# Patient Record
Sex: Female | Born: 1975 | Race: Black or African American | Hispanic: No | Smoking: Never smoker
Health system: Southern US, Community
[De-identification: ages and names within clinical notes are randomized; demographics above are authoritative.]

## PROBLEM LIST (undated history)

## (undated) DIAGNOSIS — R112 Nausea with vomiting, unspecified: Secondary | ICD-10-CM

## (undated) DIAGNOSIS — F32A Depression, unspecified: Secondary | ICD-10-CM

## (undated) DIAGNOSIS — IMO0002 Reserved for concepts with insufficient information to code with codable children: Secondary | ICD-10-CM

## (undated) DIAGNOSIS — B009 Herpesviral infection, unspecified: Secondary | ICD-10-CM

## (undated) DIAGNOSIS — Z8619 Personal history of other infectious and parasitic diseases: Secondary | ICD-10-CM

## (undated) DIAGNOSIS — F329 Major depressive disorder, single episode, unspecified: Secondary | ICD-10-CM

## (undated) DIAGNOSIS — F419 Anxiety disorder, unspecified: Secondary | ICD-10-CM

## (undated) DIAGNOSIS — M9420 Chondromalacia, unspecified site: Secondary | ICD-10-CM

## (undated) DIAGNOSIS — L729 Follicular cyst of the skin and subcutaneous tissue, unspecified: Secondary | ICD-10-CM

## (undated) DIAGNOSIS — G43909 Migraine, unspecified, not intractable, without status migrainosus: Secondary | ICD-10-CM

## (undated) DIAGNOSIS — Z9889 Other specified postprocedural states: Secondary | ICD-10-CM

## (undated) DIAGNOSIS — N3010 Interstitial cystitis (chronic) without hematuria: Secondary | ICD-10-CM

## (undated) DIAGNOSIS — E559 Vitamin D deficiency, unspecified: Secondary | ICD-10-CM

## (undated) DIAGNOSIS — M1990 Unspecified osteoarthritis, unspecified site: Secondary | ICD-10-CM

## (undated) DIAGNOSIS — G5600 Carpal tunnel syndrome, unspecified upper limb: Secondary | ICD-10-CM

## (undated) DIAGNOSIS — L732 Hidradenitis suppurativa: Secondary | ICD-10-CM

## (undated) HISTORY — PX: TUMOR REMOVAL: SHX12

## (undated) HISTORY — DX: Hidradenitis suppurativa: L73.2

## (undated) HISTORY — PX: CARPAL TUNNEL RELEASE: SHX101

## (undated) HISTORY — DX: Major depressive disorder, single episode, unspecified: F32.9

## (undated) HISTORY — PX: WISDOM TOOTH EXTRACTION: SHX21

## (undated) HISTORY — PX: BREAST CYST EXCISION: SHX579

## (undated) HISTORY — DX: Chondromalacia, unspecified site: M94.20

## (undated) HISTORY — DX: Herpesviral infection, unspecified: B00.9

## (undated) HISTORY — DX: Vitamin D deficiency, unspecified: E55.9

## (undated) HISTORY — DX: Unspecified osteoarthritis, unspecified site: M19.90

## (undated) HISTORY — DX: Personal history of other infectious and parasitic diseases: Z86.19

## (undated) HISTORY — DX: Reserved for concepts with insufficient information to code with codable children: IMO0002

## (undated) HISTORY — DX: Carpal tunnel syndrome, unspecified upper limb: G56.00

## (undated) HISTORY — DX: Depression, unspecified: F32.A

## (undated) HISTORY — PX: KNEE ARTHROSCOPY: SHX127

## (undated) HISTORY — DX: Follicular cyst of the skin and subcutaneous tissue, unspecified: L72.9

## (undated) HISTORY — DX: Anxiety disorder, unspecified: F41.9

## (undated) HISTORY — DX: Migraine, unspecified, not intractable, without status migrainosus: G43.909

## (undated) HISTORY — DX: Interstitial cystitis (chronic) without hematuria: N30.10

---

## 1998-02-15 ENCOUNTER — Emergency Department (HOSPITAL_COMMUNITY): Admission: EM | Admit: 1998-02-15 | Discharge: 1998-02-15 | Payer: Self-pay | Admitting: Emergency Medicine

## 1998-07-06 ENCOUNTER — Emergency Department (HOSPITAL_COMMUNITY): Admission: EM | Admit: 1998-07-06 | Discharge: 1998-07-06 | Payer: Self-pay | Admitting: Emergency Medicine

## 1998-07-06 ENCOUNTER — Encounter: Payer: Self-pay | Admitting: Emergency Medicine

## 1999-03-06 ENCOUNTER — Emergency Department (HOSPITAL_COMMUNITY): Admission: EM | Admit: 1999-03-06 | Discharge: 1999-03-06 | Payer: Self-pay | Admitting: Emergency Medicine

## 1999-05-22 ENCOUNTER — Inpatient Hospital Stay (HOSPITAL_COMMUNITY): Admission: AD | Admit: 1999-05-22 | Discharge: 1999-05-28 | Payer: Self-pay | Admitting: *Deleted

## 1999-08-09 ENCOUNTER — Other Ambulatory Visit: Admission: RE | Admit: 1999-08-09 | Discharge: 1999-08-22 | Payer: Self-pay | Admitting: Psychiatry

## 2000-06-30 ENCOUNTER — Encounter: Payer: Self-pay | Admitting: Gastroenterology

## 2000-06-30 ENCOUNTER — Ambulatory Visit (HOSPITAL_COMMUNITY): Admission: RE | Admit: 2000-06-30 | Discharge: 2000-06-30 | Payer: Self-pay | Admitting: Gastroenterology

## 2000-07-01 ENCOUNTER — Ambulatory Visit (HOSPITAL_COMMUNITY): Admission: RE | Admit: 2000-07-01 | Discharge: 2000-07-01 | Payer: Self-pay | Admitting: Gastroenterology

## 2000-07-16 ENCOUNTER — Other Ambulatory Visit: Admission: RE | Admit: 2000-07-16 | Discharge: 2000-07-16 | Payer: Self-pay | Admitting: Obstetrics and Gynecology

## 2000-07-16 ENCOUNTER — Encounter (INDEPENDENT_AMBULATORY_CARE_PROVIDER_SITE_OTHER): Payer: Self-pay

## 2000-08-26 DIAGNOSIS — IMO0002 Reserved for concepts with insufficient information to code with codable children: Secondary | ICD-10-CM

## 2000-08-26 DIAGNOSIS — R87619 Unspecified abnormal cytological findings in specimens from cervix uteri: Secondary | ICD-10-CM

## 2000-08-26 HISTORY — DX: Reserved for concepts with insufficient information to code with codable children: IMO0002

## 2000-08-26 HISTORY — DX: Unspecified abnormal cytological findings in specimens from cervix uteri: R87.619

## 2000-09-14 ENCOUNTER — Inpatient Hospital Stay (HOSPITAL_COMMUNITY): Admission: AD | Admit: 2000-09-14 | Discharge: 2000-09-14 | Payer: Self-pay | Admitting: Obstetrics and Gynecology

## 2000-11-04 ENCOUNTER — Emergency Department (HOSPITAL_COMMUNITY): Admission: EM | Admit: 2000-11-04 | Discharge: 2000-11-04 | Payer: Self-pay | Admitting: Emergency Medicine

## 2001-02-04 ENCOUNTER — Emergency Department (HOSPITAL_COMMUNITY): Admission: EM | Admit: 2001-02-04 | Discharge: 2001-02-05 | Payer: Self-pay | Admitting: Emergency Medicine

## 2001-02-05 ENCOUNTER — Encounter: Payer: Self-pay | Admitting: Emergency Medicine

## 2001-04-25 ENCOUNTER — Emergency Department (HOSPITAL_COMMUNITY): Admission: EM | Admit: 2001-04-25 | Discharge: 2001-04-26 | Payer: Self-pay | Admitting: Emergency Medicine

## 2001-04-26 ENCOUNTER — Encounter: Payer: Self-pay | Admitting: Emergency Medicine

## 2001-08-19 DIAGNOSIS — IMO0002 Reserved for concepts with insufficient information to code with codable children: Secondary | ICD-10-CM

## 2001-08-19 HISTORY — DX: Reserved for concepts with insufficient information to code with codable children: IMO0002

## 2001-10-28 ENCOUNTER — Other Ambulatory Visit: Admission: RE | Admit: 2001-10-28 | Discharge: 2001-10-28 | Payer: Self-pay | Admitting: Obstetrics and Gynecology

## 2002-02-12 ENCOUNTER — Other Ambulatory Visit: Admission: RE | Admit: 2002-02-12 | Discharge: 2002-02-12 | Payer: Self-pay | Admitting: Obstetrics and Gynecology

## 2002-03-04 ENCOUNTER — Encounter (INDEPENDENT_AMBULATORY_CARE_PROVIDER_SITE_OTHER): Payer: Self-pay | Admitting: *Deleted

## 2002-03-04 ENCOUNTER — Ambulatory Visit (HOSPITAL_COMMUNITY): Admission: RE | Admit: 2002-03-04 | Discharge: 2002-03-04 | Payer: Self-pay | Admitting: Obstetrics and Gynecology

## 2002-03-12 ENCOUNTER — Ambulatory Visit (HOSPITAL_COMMUNITY): Admission: RE | Admit: 2002-03-12 | Discharge: 2002-03-12 | Payer: Self-pay | Admitting: *Deleted

## 2002-03-12 ENCOUNTER — Encounter (INDEPENDENT_AMBULATORY_CARE_PROVIDER_SITE_OTHER): Payer: Self-pay

## 2002-06-15 ENCOUNTER — Encounter: Admission: RE | Admit: 2002-06-15 | Discharge: 2002-07-13 | Payer: Self-pay | Admitting: Family Medicine

## 2002-07-26 ENCOUNTER — Ambulatory Visit (HOSPITAL_COMMUNITY): Admission: RE | Admit: 2002-07-26 | Discharge: 2002-07-26 | Payer: Self-pay | Admitting: Obstetrics and Gynecology

## 2002-07-26 ENCOUNTER — Encounter: Payer: Self-pay | Admitting: Obstetrics and Gynecology

## 2002-12-22 ENCOUNTER — Encounter: Admission: RE | Admit: 2002-12-22 | Discharge: 2002-12-22 | Payer: Self-pay | Admitting: Chiropractic Medicine

## 2002-12-22 ENCOUNTER — Encounter: Payer: Self-pay | Admitting: Chiropractic Medicine

## 2003-01-05 ENCOUNTER — Other Ambulatory Visit: Admission: RE | Admit: 2003-01-05 | Discharge: 2003-01-05 | Payer: Self-pay | Admitting: *Deleted

## 2003-01-12 ENCOUNTER — Emergency Department (HOSPITAL_COMMUNITY): Admission: EM | Admit: 2003-01-12 | Discharge: 2003-01-12 | Payer: Self-pay | Admitting: Emergency Medicine

## 2003-05-26 ENCOUNTER — Ambulatory Visit (HOSPITAL_COMMUNITY): Admission: RE | Admit: 2003-05-26 | Discharge: 2003-05-26 | Payer: Self-pay | Admitting: Surgery

## 2003-05-26 ENCOUNTER — Ambulatory Visit (HOSPITAL_BASED_OUTPATIENT_CLINIC_OR_DEPARTMENT_OTHER): Admission: RE | Admit: 2003-05-26 | Discharge: 2003-05-26 | Payer: Self-pay | Admitting: Surgery

## 2003-05-26 ENCOUNTER — Encounter (INDEPENDENT_AMBULATORY_CARE_PROVIDER_SITE_OTHER): Payer: Self-pay | Admitting: Specialist

## 2003-10-16 ENCOUNTER — Emergency Department (HOSPITAL_COMMUNITY): Admission: AD | Admit: 2003-10-16 | Discharge: 2003-10-16 | Payer: Self-pay | Admitting: Family Medicine

## 2003-12-14 ENCOUNTER — Emergency Department (HOSPITAL_COMMUNITY): Admission: EM | Admit: 2003-12-14 | Discharge: 2003-12-14 | Payer: Self-pay | Admitting: Family Medicine

## 2003-12-20 ENCOUNTER — Encounter: Admission: RE | Admit: 2003-12-20 | Discharge: 2004-01-19 | Payer: Self-pay | Admitting: Family Medicine

## 2004-01-15 ENCOUNTER — Encounter: Admission: RE | Admit: 2004-01-15 | Discharge: 2004-01-15 | Payer: Self-pay | Admitting: Family Medicine

## 2004-06-21 ENCOUNTER — Other Ambulatory Visit: Admission: RE | Admit: 2004-06-21 | Discharge: 2004-06-21 | Payer: Self-pay | Admitting: Obstetrics and Gynecology

## 2004-07-10 ENCOUNTER — Ambulatory Visit: Payer: Self-pay | Admitting: Family Medicine

## 2004-07-31 ENCOUNTER — Encounter: Admission: RE | Admit: 2004-07-31 | Discharge: 2004-07-31 | Payer: Self-pay | Admitting: Family Medicine

## 2004-07-31 ENCOUNTER — Ambulatory Visit: Payer: Self-pay | Admitting: Family Medicine

## 2004-08-08 ENCOUNTER — Emergency Department (HOSPITAL_COMMUNITY): Admission: EM | Admit: 2004-08-08 | Discharge: 2004-08-08 | Payer: Self-pay | Admitting: Family Medicine

## 2004-11-27 ENCOUNTER — Ambulatory Visit: Payer: Self-pay | Admitting: Family Medicine

## 2004-12-14 ENCOUNTER — Ambulatory Visit: Payer: Self-pay | Admitting: Family Medicine

## 2005-02-13 ENCOUNTER — Ambulatory Visit: Payer: Self-pay | Admitting: Family Medicine

## 2005-06-19 ENCOUNTER — Ambulatory Visit: Payer: Self-pay | Admitting: Family Medicine

## 2005-07-05 ENCOUNTER — Other Ambulatory Visit: Admission: RE | Admit: 2005-07-05 | Discharge: 2005-07-05 | Payer: Self-pay | Admitting: Obstetrics and Gynecology

## 2005-08-19 DIAGNOSIS — B009 Herpesviral infection, unspecified: Secondary | ICD-10-CM

## 2005-08-19 DIAGNOSIS — N3010 Interstitial cystitis (chronic) without hematuria: Secondary | ICD-10-CM

## 2005-08-19 HISTORY — DX: Interstitial cystitis (chronic) without hematuria: N30.10

## 2005-08-19 HISTORY — DX: Herpesviral infection, unspecified: B00.9

## 2005-09-17 ENCOUNTER — Ambulatory Visit: Payer: Self-pay | Admitting: Family Medicine

## 2006-01-03 ENCOUNTER — Ambulatory Visit: Payer: Self-pay | Admitting: Family Medicine

## 2006-05-07 ENCOUNTER — Ambulatory Visit: Payer: Self-pay | Admitting: Family Medicine

## 2006-05-14 ENCOUNTER — Ambulatory Visit: Payer: Self-pay | Admitting: Family Medicine

## 2006-06-13 ENCOUNTER — Ambulatory Visit: Payer: Self-pay | Admitting: Family Medicine

## 2006-07-08 ENCOUNTER — Other Ambulatory Visit: Admission: RE | Admit: 2006-07-08 | Discharge: 2006-07-08 | Payer: Self-pay | Admitting: Obstetrics and Gynecology

## 2006-09-07 ENCOUNTER — Emergency Department (HOSPITAL_COMMUNITY): Admission: EM | Admit: 2006-09-07 | Discharge: 2006-09-07 | Payer: Self-pay | Admitting: Family Medicine

## 2006-09-12 ENCOUNTER — Ambulatory Visit (HOSPITAL_COMMUNITY): Admission: RE | Admit: 2006-09-12 | Discharge: 2006-09-12 | Payer: Self-pay | Admitting: Urology

## 2006-09-28 ENCOUNTER — Emergency Department (HOSPITAL_COMMUNITY): Admission: EM | Admit: 2006-09-28 | Discharge: 2006-09-28 | Payer: Self-pay | Admitting: Family Medicine

## 2006-10-07 ENCOUNTER — Ambulatory Visit: Payer: Self-pay | Admitting: Family Medicine

## 2007-03-21 ENCOUNTER — Ambulatory Visit: Payer: Self-pay | Admitting: Family Medicine

## 2007-04-05 ENCOUNTER — Emergency Department (HOSPITAL_COMMUNITY): Admission: EM | Admit: 2007-04-05 | Discharge: 2007-04-05 | Payer: Self-pay | Admitting: Emergency Medicine

## 2007-05-26 DIAGNOSIS — J45909 Unspecified asthma, uncomplicated: Secondary | ICD-10-CM | POA: Insufficient documentation

## 2007-05-26 DIAGNOSIS — I10 Essential (primary) hypertension: Secondary | ICD-10-CM

## 2007-06-02 ENCOUNTER — Ambulatory Visit: Payer: Self-pay | Admitting: Family Medicine

## 2007-06-19 ENCOUNTER — Ambulatory Visit: Payer: Self-pay | Admitting: Family Medicine

## 2007-06-19 DIAGNOSIS — J018 Other acute sinusitis: Secondary | ICD-10-CM

## 2007-06-19 DIAGNOSIS — S335XXA Sprain of ligaments of lumbar spine, initial encounter: Secondary | ICD-10-CM

## 2007-08-02 ENCOUNTER — Emergency Department (HOSPITAL_COMMUNITY): Admission: EM | Admit: 2007-08-02 | Discharge: 2007-08-02 | Payer: Self-pay | Admitting: Emergency Medicine

## 2007-08-18 ENCOUNTER — Ambulatory Visit: Payer: Self-pay | Admitting: Family Medicine

## 2007-08-18 DIAGNOSIS — G5600 Carpal tunnel syndrome, unspecified upper limb: Secondary | ICD-10-CM

## 2007-08-18 DIAGNOSIS — M6740 Ganglion, unspecified site: Secondary | ICD-10-CM | POA: Insufficient documentation

## 2007-08-21 ENCOUNTER — Encounter: Payer: Self-pay | Admitting: Family Medicine

## 2007-09-18 ENCOUNTER — Ambulatory Visit: Payer: Self-pay | Admitting: Family Medicine

## 2007-09-18 DIAGNOSIS — J019 Acute sinusitis, unspecified: Secondary | ICD-10-CM

## 2007-10-20 ENCOUNTER — Emergency Department (HOSPITAL_COMMUNITY): Admission: EM | Admit: 2007-10-20 | Discharge: 2007-10-20 | Payer: Self-pay | Admitting: Family Medicine

## 2007-12-29 ENCOUNTER — Emergency Department (HOSPITAL_COMMUNITY): Admission: EM | Admit: 2007-12-29 | Discharge: 2007-12-29 | Payer: Self-pay | Admitting: Family Medicine

## 2007-12-30 ENCOUNTER — Ambulatory Visit: Payer: Self-pay | Admitting: Family Medicine

## 2007-12-30 DIAGNOSIS — R519 Headache, unspecified: Secondary | ICD-10-CM | POA: Insufficient documentation

## 2007-12-30 DIAGNOSIS — R51 Headache: Secondary | ICD-10-CM

## 2007-12-31 LAB — CONVERTED CEMR LAB
ALT: 12 units/L (ref 0–35)
AST: 15 units/L (ref 0–37)
Basophils Relative: 0.2 % (ref 0.0–1.0)
CO2: 27 meq/L (ref 19–32)
GFR calc Af Amer: 126 mL/min
Glucose, Bld: 94 mg/dL (ref 70–99)
HCT: 37.9 % (ref 36.0–46.0)
HDL: 44.8 mg/dL (ref >39.0)
Hemoglobin: 12.6 g/dL (ref 12.0–15.0)
Lymphocytes Relative: 38.8 % (ref 12.0–46.0)
MCHC: 33.3 g/dL (ref 30.0–36.0)
Monocytes Absolute: 0.3 10*3/uL (ref 0.1–1.0)
Monocytes Relative: 8.1 % (ref 3.0–12.0)
Neutro Abs: 2.1 10*3/uL (ref 1.4–7.7)
Neutrophils Relative %: 52.1 % (ref 43.0–77.0)
Platelets: 208 10*3/uL (ref 150–400)
Total Bilirubin: 0.7 mg/dL (ref 0.3–1.2)
Triglycerides: 51 mg/dL (ref 0–149)

## 2008-01-01 ENCOUNTER — Emergency Department (HOSPITAL_COMMUNITY): Admission: EM | Admit: 2008-01-01 | Discharge: 2008-01-01 | Payer: Self-pay | Admitting: Emergency Medicine

## 2008-01-01 ENCOUNTER — Telehealth: Payer: Self-pay | Admitting: Family Medicine

## 2008-03-25 ENCOUNTER — Ambulatory Visit: Payer: Self-pay | Admitting: Family Medicine

## 2008-03-25 DIAGNOSIS — E663 Overweight: Secondary | ICD-10-CM | POA: Insufficient documentation

## 2008-03-25 DIAGNOSIS — K219 Gastro-esophageal reflux disease without esophagitis: Secondary | ICD-10-CM

## 2008-05-13 ENCOUNTER — Emergency Department (HOSPITAL_COMMUNITY): Admission: EM | Admit: 2008-05-13 | Discharge: 2008-05-13 | Payer: Self-pay | Admitting: Family Medicine

## 2008-05-31 ENCOUNTER — Ambulatory Visit: Payer: Self-pay | Admitting: Family Medicine

## 2008-08-05 ENCOUNTER — Encounter: Payer: Self-pay | Admitting: Family Medicine

## 2008-11-17 ENCOUNTER — Ambulatory Visit: Payer: Self-pay | Admitting: Family Medicine

## 2008-11-17 LAB — CONVERTED CEMR LAB
Glucose, Urine, Semiquant: NEGATIVE
Ketones, urine, test strip: NEGATIVE
Nitrite: NEGATIVE
Urobilinogen, UA: 0.2
pH: 7

## 2008-11-23 ENCOUNTER — Encounter: Payer: Self-pay | Admitting: Family Medicine

## 2008-11-23 LAB — CONVERTED CEMR LAB
Albumin: 4 g/dL (ref 3.5–5.2)
BUN: 11 mg/dL (ref 6–23)
Basophils Relative: 0.2 % (ref 0.0–3.0)
CO2: 28 meq/L (ref 19–32)
Calcium: 9.2 mg/dL (ref 8.4–10.5)
Cholesterol: 160 mg/dL (ref 0–200)
Creatinine, Ser: 0.9 mg/dL (ref 0.4–1.2)
Eosinophils Absolute: 0 10*3/uL (ref 0.0–0.7)
HCT: 36.2 % (ref 36.0–46.0)
Hemoglobin: 12.5 g/dL (ref 12.0–15.0)
Lymphs Abs: 1.7 10*3/uL (ref 0.7–4.0)
MCHC: 34.6 g/dL (ref 30.0–36.0)
MCV: 86.2 fL (ref 78.0–100.0)
Monocytes Absolute: 0.3 10*3/uL (ref 0.1–1.0)
Monocytes Relative: 7.7 % (ref 3.0–12.0)
Neutro Abs: 1.9 10*3/uL (ref 1.4–7.7)
Potassium: 4.2 meq/L (ref 3.5–5.1)
RBC: 4.2 M/uL (ref 3.87–5.11)
TSH: 0.72 microintl units/mL (ref 0.35–5.50)
Total Protein: 7.1 g/dL (ref 6.0–8.3)

## 2008-11-25 ENCOUNTER — Ambulatory Visit: Payer: Self-pay | Admitting: Family Medicine

## 2008-11-28 ENCOUNTER — Telehealth: Payer: Self-pay | Admitting: Family Medicine

## 2008-12-02 ENCOUNTER — Ambulatory Visit: Payer: Self-pay | Admitting: Family Medicine

## 2008-12-02 DIAGNOSIS — R5383 Other fatigue: Secondary | ICD-10-CM

## 2008-12-02 DIAGNOSIS — R5381 Other malaise: Secondary | ICD-10-CM

## 2008-12-05 ENCOUNTER — Encounter: Payer: Self-pay | Admitting: Family Medicine

## 2008-12-05 LAB — CONVERTED CEMR LAB
Folate: 9.8 ng/mL
Free T4: 0.8 ng/dL (ref 0.6–1.6)
T3, Free: 2.8 pg/mL (ref 2.3–4.2)
Vitamin B-12: 549 pg/mL (ref 211–911)

## 2008-12-06 LAB — CONVERTED CEMR LAB: Vit D, 25-Hydroxy: 48 ng/mL (ref 30–89)

## 2008-12-14 ENCOUNTER — Encounter: Payer: Self-pay | Admitting: Family Medicine

## 2008-12-28 ENCOUNTER — Encounter: Payer: Self-pay | Admitting: Family Medicine

## 2009-03-09 ENCOUNTER — Ambulatory Visit (HOSPITAL_BASED_OUTPATIENT_CLINIC_OR_DEPARTMENT_OTHER): Admission: RE | Admit: 2009-03-09 | Discharge: 2009-03-09 | Payer: Self-pay | Admitting: Orthopedic Surgery

## 2009-04-13 ENCOUNTER — Ambulatory Visit (HOSPITAL_BASED_OUTPATIENT_CLINIC_OR_DEPARTMENT_OTHER): Admission: RE | Admit: 2009-04-13 | Discharge: 2009-04-13 | Payer: Self-pay | Admitting: Orthopedic Surgery

## 2009-05-12 ENCOUNTER — Ambulatory Visit: Payer: Self-pay | Admitting: Family Medicine

## 2009-05-17 ENCOUNTER — Ambulatory Visit: Payer: Self-pay | Admitting: Family Medicine

## 2009-12-16 ENCOUNTER — Emergency Department (HOSPITAL_COMMUNITY): Admission: EM | Admit: 2009-12-16 | Discharge: 2009-12-16 | Payer: Self-pay | Admitting: Family Medicine

## 2009-12-20 ENCOUNTER — Ambulatory Visit: Payer: Self-pay | Admitting: Family Medicine

## 2009-12-20 LAB — CONVERTED CEMR LAB
Glucose, Urine, Semiquant: NEGATIVE
Urobilinogen, UA: 0.2
pH: 8

## 2009-12-22 LAB — CONVERTED CEMR LAB
ALT: 10 units/L (ref 0–35)
AST: 16 units/L (ref 0–37)
Albumin: 3.5 g/dL (ref 3.5–5.2)
CO2: 27 meq/L (ref 19–32)
Calcium: 9 mg/dL (ref 8.4–10.5)
Eosinophils Absolute: 0 10*3/uL (ref 0.0–0.7)
Glucose, Bld: 84 mg/dL (ref 70–99)
HCT: 37.2 % (ref 36.0–46.0)
HDL: 55.1 mg/dL (ref >39.00)
Lymphocytes Relative: 36.9 % (ref 12.0–46.0)
Lymphs Abs: 2 10*3/uL (ref 0.7–4.0)
MCV: 86.4 fL (ref 78.0–100.0)
Monocytes Absolute: 0.3 10*3/uL (ref 0.1–1.0)
Monocytes Relative: 6 % (ref 3.0–12.0)
Neutrophils Relative %: 56.3 % (ref 43.0–77.0)
RDW: 13.5 % (ref 11.5–14.6)
Sodium: 141 meq/L (ref 135–145)
TSH: 1.49 microintl units/mL (ref 0.35–5.50)
Triglycerides: 142 mg/dL (ref 0.0–149.0)

## 2009-12-27 ENCOUNTER — Ambulatory Visit: Payer: Self-pay | Admitting: Family Medicine

## 2010-05-19 ENCOUNTER — Emergency Department (HOSPITAL_COMMUNITY): Admission: EM | Admit: 2010-05-19 | Discharge: 2010-05-19 | Payer: Self-pay | Admitting: Family Medicine

## 2010-05-30 ENCOUNTER — Ambulatory Visit: Payer: Self-pay | Admitting: Family Medicine

## 2010-05-30 DIAGNOSIS — S46819A Strain of other muscles, fascia and tendons at shoulder and upper arm level, unspecified arm, initial encounter: Secondary | ICD-10-CM

## 2010-05-30 DIAGNOSIS — S43499A Other sprain of unspecified shoulder joint, initial encounter: Secondary | ICD-10-CM

## 2010-06-04 ENCOUNTER — Encounter: Payer: Self-pay | Admitting: Family Medicine

## 2010-06-05 ENCOUNTER — Telehealth: Payer: Self-pay | Admitting: Family Medicine

## 2010-06-07 ENCOUNTER — Telehealth: Payer: Self-pay | Admitting: Family Medicine

## 2010-06-08 ENCOUNTER — Encounter: Payer: Self-pay | Admitting: Family Medicine

## 2010-08-23 ENCOUNTER — Emergency Department (HOSPITAL_COMMUNITY)
Admission: EM | Admit: 2010-08-23 | Discharge: 2010-08-23 | Payer: Self-pay | Source: Home / Self Care | Admitting: Family Medicine

## 2010-09-20 NOTE — Letter (Signed)
Summary: Attending Physician's Statement for Short Term Disability   Attending Physician's Statement for Short Term Disability   Imported By: Maryln Gottron 06/05/2010 13:56:49  _____________________________________________________________________  External Attachment:    Type:   Image     Comment:   External Document

## 2010-09-20 NOTE — Letter (Signed)
Summary: Generic Letter  [place] [facility]  [address] [place], [place] [address]   Phone: [phone]  Fax: [phone]    06/08/2010       To Whom It May Concern:  Pamela Lopez , DOB 27-Oct-1975  may return to work on Jun 10, 2010 full duty with no restrictions.  If any questions or concerns please feel free to contact me.            Sincerely,       Dr Kathie Rhodes. Fry,MD

## 2010-09-20 NOTE — Letter (Signed)
Summary: Return to Work   Return to Work   Imported By: Maryln Gottron 06/01/2010 15:24:01  _____________________________________________________________________  External Attachment:    Type:   Image     Comment:   External Document

## 2010-09-20 NOTE — Progress Notes (Signed)
Summary: another work note with no restrictions  Phone Note Call from Patient Call back at [phone]   Caller: Patient Call For: Nelwyn Salisbury MD Summary of Call: Pt has work note to return to work on 10/23-2011 she needs another note to specify full duty no restrictions. pt will pick up note Initial call taken by: Heron Sabins,  June 07, 2010 12:26 PM  Follow-up for Phone Call        please take care of this  Follow-up by: Nelwyn Salisbury MD,  June 08, 2010 9:32 AM  Additional Follow-up for Phone Call Additional follow up Details #1::        pt notified and will pick up note today  Additional Follow-up by: Pura Spice, RN,  June 08, 2010 9:37 AM

## 2010-09-20 NOTE — Assessment & Plan Note (Signed)
Summary: Pt injured lft arm/can not extend/cjr   Vital Signs:  Patient profile:   35 year old female Menstrual status:  regular O2 Sat:      98 % Temp:     98.7 degrees F Pulse rate:   97 / minute BP sitting:   130 / 80  (left arm) Cuff size:   large  Vitals Entered By: Pura Spice, RN (May 30, 2010 2:16 PM) CC: pain in left arm since monday nite moving TV. Ck abscess area on chest area.    History of Present Illness: Here to follow up on an injury to her left arm which occurred at home on 05-28-10. While lifting a heavy TV set she felt a pulling sensation in the left upper arm. Later that day the pain became worse and the upper arm swelled. Then she noticed some tingling in the fingers of the left hand. She went to Urgent Care that night and was given some Diclofenac. This has not helped, and the arm still hurts a lot.   Allergies: 1)  ! Codeine 2)  ! * Monistat 3)  ! * Latex  Past History:  Past Medical History: Reviewed history from 12/27/2009 and no changes required. Migraines, sees Dr. Catalina Lunger at the [facility] Asthma Hypertension sees Dr. Josph Macho for GYN exams knee pains, has DJD and chondromalacia, sees Dr. Eulah Pont recurrent skin cysts, sees Joneen Caraway at [facility] Dermatology bilateral carpal tunnel syndrome, sees the [facility]  Past Surgical History: Reviewed history from 12/27/2009 and no changes required. R Breast Cyst removal left knee arthroscopy per Dr. Eulah Pont Carpal tunnel release, right 03-09-09 per Dr. Eulah Pont Carpal tunnel release, left 04-13-09 per Dr. Eulah Pont  Review of Systems  The patient denies anorexia, fever, weight loss, weight gain, vision loss, decreased hearing, hoarseness, chest pain, syncope, dyspnea on exertion, peripheral edema, prolonged cough, headaches, hemoptysis, abdominal pain, melena, hematochezia, severe indigestion/heartburn, hematuria, incontinence, genital sores, muscle weakness, suspicious skin  lesions, transient blindness, difficulty walking, depression, unusual weight change, abnormal bleeding, enlarged lymph nodes, angioedema, breast masses, and testicular masses.    Physical Exam  General:  Well-developed,well-nourished,in no acute distress; alert,appropriate and cooperative throughout examination Msk:  the left upper arm is mildly swollen and quite tender in the belly of the biceps muscle. She has full flexion of the elbow but extension is limited by pain. The tendon does not appear to be torn. Neurovascular in the left hand is intact   Impression & Recommendations:  Problem # 1:  SPRAIN&STRAIN OTH SPEC SITES SHOULDER&UPPER ARM (ICD-840.8)  Orders: Slings- All Types (E4540)  Complete Medication List: 1)  Promethazine Hcl 25 Mg Tabs (Promethazine hcl) .Marland Kitchen.. 1 every 4 hours as needed nausea 2)  Proair Hfa 108 (90 Base) Mcg/act Aers (Albuterol sulfate) .... 2 puffs q 4 hours as needed 3)  Junel Fe 1/20 1-20 Mg-mcg Tabs (Norethin ace-eth estrad-fe) .Marland Kitchen.. 1 once daily 4)  Naratriptan Hcl 2.5 Mg Tabs (Naratriptan hcl) .... As needed headache 5)  Doxycycline Hyclate 100 Mg Caps (Doxycycline hyclate) .... Urgent care x 10 days 6)  Diclofenac Sodium 75 Mg Tbec (Diclofenac sodium) 7)  Vicodin 5-500 Mg Tabs (Hydrocodone-acetaminophen) .Marland Kitchen.. 1 q 6 hours as needed pain  Patient Instructions: 1)  She appears to have a tear in the biceps muscle. The tendons seem to be intact. We will immobilize the arm with a sling for at least one week. Use ice packs. Use Vicodin for pain. Written out of work from  today until 06-10-10.  2)  Please schedule a follow-up appointment as needed .  Prescriptions: VICODIN 5-500 MG TABS (HYDROCODONE-ACETAMINOPHEN) 1 q 6 hours as needed pain  #60 x 0   Entered and Authorized by:   Nelwyn Salisbury MD   Signed by:   Nelwyn Salisbury MD on 05/30/2010   Method used:   Print then Give to Patient   RxID:   [id number]   Appended Document: Pt injured lft arm/can not  extend/cjr    Clinical Lists Changes  Orders: Added new Service order of Admin 1st Vaccine (95621) - Signed Added new Service order of Flu Vaccine 30yrs + [phone]) - Signed Observations: Added new observation of ROS: Flu Vaccine Consent Questions     Do you have a history of severe allergic reactions to this vaccine? no    Any prior history of allergic reactions to egg and/or gelatin? no    Do you have a sensitivity to the preservative Thimersol? no    Do you have a past history of Guillan-Barre Syndrome? no    Do you currently have an acute febrile illness? no    Have you ever had a severe reaction to latex? no    Vaccine information given and explained to patient? yes    Are you currently pregnant? no    Lot Number:[id number]   Exp Date:02/16/2011   Site Given  Left Deltoid IM Pura Spice, RN  May 30, 2010 3:05 PM  (05/30/2010 15:03) Added new observation of FLU VAX VIS: 03/13/10 version (05/30/2010 15:03) Added new observation of FLU VAXLOT: [id number] (05/30/2010 15:03) Added new observation of FLU VAXMFR: [employer] (05/30/2010 15:03) Added new observation of FLU VAX EXP: 02/16/2011 (05/30/2010 15:03) Added new observation of FLU VAX DSE: 0.78ml (05/30/2010 15:03) Added new observation of FLU VAX: Fluvax 3+ (05/30/2010 15:03)          Review of Systems       Flu Vaccine Consent Questions     Do you have a history of severe allergic reactions to this vaccine? no    Any prior history of allergic reactions to egg and/or gelatin? no    Do you have a sensitivity to the preservative Thimersol? no    Do you have a past history of Guillan-Barre Syndrome? no    Do you currently have an acute febrile illness? no    Have you ever had a severe reaction to latex? no    Vaccine information given and explained to patient? yes    Are you currently pregnant? no    Lot Number:[id number]   Exp Date:02/16/2011   Site Given  Left Deltoid IM Pura Spice, RN  May 30, 2010  3:05 PM

## 2010-09-20 NOTE — Assessment & Plan Note (Signed)
Summary: cpx---no pap//ccm   Vital Signs:  Patient profile:   35 year old female Menstrual status:  regular Weight:      216 pounds BMI:     34.47 BP sitting:   120 / 86  (left arm) Cuff size:   regular  Vitals Entered By: Raechel Ache, RN (Dec 27, 2009 10:02 AM) CC: CPX, labs done. Sees gyn. C/o headaches and went to ER about 2 weeks ago; was diagnosed with vertigo.   History of Present Illness: 35 yr old female for a cpx. She is doing well in general, although she has been dealing with some intermittent vertigo for the past 2 weeks. She was seen in the ER last week and had a normal exam. She was given some Meclizine to use, and this has been helpful. She has had some sinus stuffines and some sneezing for the past month, and it is evident that she has been dealing with pollen allergies.   Allergies: 1)  ! Codeine 2)  ! * Monistat 3)  ! * Latex  Past History:  Past Medical History: Migraines, sees Dr. Catalina Lunger at the [facility] Asthma Hypertension sees Dr. Josph Macho for GYN exams knee pains, has DJD and chondromalacia, sees Dr. Eulah Pont recurrent skin cysts, sees Joneen Caraway at [facility] Dermatology bilateral carpal tunnel syndrome, sees the [facility]  Past Surgical History: R Breast Cyst removal left knee arthroscopy per Dr. Eulah Pont Carpal tunnel release, right 03-09-09 per Dr. Eulah Pont Carpal tunnel release, left 04-13-09 per Dr. Eulah Pont  Family History: Reviewed history from 05/26/2007 and no changes required. Family History of Arthritis Family History Breast cancer 1st degree relative <50 Family History Diabetes 1st degree relative Family History High cholesterol Family History Hypertension Family History Kidney disease Family History of Prostate CA 1st degree relative <50 Family History of Stroke M 1st degree relative <50 Family History of Cardiovascular disorder  Social History: Reviewed history from 05/26/2007 and no changes  required. Occupation: Single Never Smoked Alcohol use-no Drug use-no Regular exercise-yes  Review of Systems  The patient denies anorexia, fever, weight loss, weight gain, vision loss, decreased hearing, hoarseness, chest pain, syncope, dyspnea on exertion, peripheral edema, prolonged cough, headaches, hemoptysis, abdominal pain, melena, hematochezia, severe indigestion/heartburn, hematuria, incontinence, genital sores, muscle weakness, suspicious skin lesions, transient blindness, difficulty walking, depression, unusual weight change, abnormal bleeding, enlarged lymph nodes, angioedema, breast masses, and testicular masses.    Physical Exam  General:  overweight-appearing.   Head:  Normocephalic and atraumatic without obvious abnormalities. No apparent alopecia or balding. Eyes:  No corneal or conjunctival inflammation noted. EOMI. Perrla. Funduscopic exam benign, without hemorrhages, exudates or papilledema. Vision grossly normal. Ears:  External ear exam shows no significant lesions or deformities.  Otoscopic examination reveals clear canals, tympanic membranes are intact bilaterally without bulging, retraction, inflammation or discharge. Hearing is grossly normal bilaterally. Nose:  External nasal examination shows no deformity or inflammation. Nasal mucosa are pink and moist without lesions or exudates. Mouth:  Oral mucosa and oropharynx without lesions or exudates.  Teeth in good repair. Neck:  No deformities, masses, or tenderness noted. Chest Wall:  No deformities, masses, or tenderness noted. Lungs:  Normal respiratory effort, chest expands symmetrically. Lungs are clear to auscultation, no crackles or wheezes. Heart:  Normal rate and regular rhythm. S1 and S2 normal without gallop, murmur, click, rub or other extra sounds. Abdomen:  Bowel sounds positive,abdomen soft and non-tender without masses, organomegaly or hernias noted. Msk:  No deformity or  scoliosis noted of thoracic or  lumbar spine.   Pulses:  R and L carotid,radial,femoral,dorsalis pedis and posterior tibial pulses are full and equal bilaterally Extremities:  No clubbing, cyanosis, edema, or deformity noted with normal full range of motion of all joints.   Neurologic:  No cranial nerve deficits noted. Station and gait are normal. Plantar reflexes are down-going bilaterally. DTRs are symmetrical throughout. Sensory, motor and coordinative functions appear intact. Skin:  Intact without suspicious lesions or rashes Cervical Nodes:  No lymphadenopathy noted Axillary Nodes:  No palpable lymphadenopathy Inguinal Nodes:  No significant adenopathy Psych:  Cognition and judgment appear intact. Alert and cooperative with normal attention span and concentration. No apparent delusions, illusions, hallucinations   Impression & Recommendations:  Problem # 1:  PHYSICAL EXAMINATION (ICD-V70.0)  Complete Medication List: 1)  Promethazine Hcl 25 Mg Tabs (Promethazine hcl) .Marland Kitchen.. 1 every 4 hours as needed nausea 2)  Topamax 100 Mg Tabs (Topiramate) .... 2 by mouth at bedtime 3)  Proair Hfa 108 (90 Base) Mcg/act Aers (Albuterol sulfate) .... 2 puffs q 4 hours as needed 4)  Junel Fe 1/20 1-20 Mg-mcg Tabs (Norethin ace-eth estrad-fe) .Marland Kitchen.. 1 once daily 5)  Naratriptan Hcl 2.5 Mg Tabs (Naratriptan hcl) .... As needed headache  Patient Instructions: 1)  It is important that you exercise reguarly at least 20 minutes 5 times a week. If you develop chest pain, have severe difficulty breathing, or feel very tired, stop exercising immediately and seek medical attention.  2)  You need to lose weight. Consider a lower calorie diet and regular exercise.  3)  I think allergies are contributing to her vertigo, so I advised her to take a Zyrtec every day for a month or so Prescriptions: PROAIR HFA 108 (90 BASE) MCG/ACT AERS (ALBUTEROL SULFATE) 2 puffs q 4 hours as needed  #1 x 11   Entered and Authorized by:   Nelwyn Salisbury MD   Signed  by:   Nelwyn Salisbury MD on 12/27/2009   Method used:   Electronically to        [facility]  [address] [id number] (retail)       [address]       [place], [place]  [address]       Ph: [phone] or [phone]       Fax: [phone]   RxID:   [id number] PROMETHAZINE HCL 25 MG  TABS (PROMETHAZINE HCL) 1 every 4 hours as needed nausea  #60 x 11   Entered and Authorized by:   Nelwyn Salisbury MD   Signed by:   Nelwyn Salisbury MD on 12/27/2009   Method used:   Electronically to        [facility]  [address] [id number] ([facility]       [address]       [place], [place]  [address]       Ph: [phone] or [phone]       Fax: [phone]   RxID:   [id number]

## 2010-09-20 NOTE — Progress Notes (Signed)
Summary: ov notes needed  Phone Note Call from Patient Call back at Home Phone [phone] Call back at [phone]   Caller: Patient---voice mail Summary of Call: was seen on the 12th and Dr Clent Ridges took her out of work until the 23rd. She made a decision to file a short term disability claim within that period. She just received  a call from [place], which they are requesting ov notes from the 12th. please fax to [phone].  Initial call taken by: Warnell Forester,  June 05, 2010 9:33 AM  Follow-up for Phone Call        please take care of this  Follow-up by: Nelwyn Salisbury MD,  June 05, 2010 1:41 PM  Additional Follow-up for Phone Call Additional follow up Details #1::        Phone Call Completed,faxed note for 05/30/10 to [facility]. Additional Follow-up by: Drue Stager,  June 06, 2010 9:40 AM

## 2010-10-23 ENCOUNTER — Emergency Department (HOSPITAL_COMMUNITY)
Admission: EM | Admit: 2010-10-23 | Discharge: 2010-10-23 | Disposition: A | Discharge status: Home / Self Care | Payer: BC Managed Care – PPO | Attending: Emergency Medicine | Admitting: Emergency Medicine

## 2010-10-23 ENCOUNTER — Emergency Department (HOSPITAL_COMMUNITY): Payer: BC Managed Care – PPO

## 2010-10-23 DIAGNOSIS — M542 Cervicalgia: Secondary | ICD-10-CM | POA: Insufficient documentation

## 2010-10-23 DIAGNOSIS — R079 Chest pain, unspecified: Secondary | ICD-10-CM | POA: Insufficient documentation

## 2010-10-23 DIAGNOSIS — R0602 Shortness of breath: Secondary | ICD-10-CM | POA: Insufficient documentation

## 2010-10-23 LAB — TROPONIN I: Troponin I: 0.01 ng/mL (ref 0.00–0.06)

## 2010-10-23 LAB — CK TOTAL AND CKMB (NOT AT ARMC): Relative Index: INVALID (ref 0.0–2.5)

## 2010-10-24 ENCOUNTER — Telehealth: Payer: Self-pay | Admitting: Family Medicine

## 2010-10-24 NOTE — Telephone Encounter (Signed)
Pt called in and was worked into schedule for 10/25/2010 per Dr Clent Ridges....... 10/24/10 @ 4:17pm // rs

## 2010-10-24 NOTE — Telephone Encounter (Signed)
Pt called to adv that she was seen in the ED yesterday for CP.... Released to go home, adv may have some inflammation around lungs...Marland KitchenMarland Kitchen bloodwork normal.... Adv for her to f/u with her PCP within the week.... Can you advise a slot ( time allowance) that pt can come in for post ED visit?   Pt can be reached at [phone]  Or  [phone].

## 2010-10-24 NOTE — Telephone Encounter (Signed)
Work her in this week?

## 2010-10-25 ENCOUNTER — Ambulatory Visit (INDEPENDENT_AMBULATORY_CARE_PROVIDER_SITE_OTHER): Payer: BC Managed Care – PPO | Admitting: Family Medicine

## 2010-10-25 ENCOUNTER — Encounter: Payer: Self-pay | Admitting: Family Medicine

## 2010-10-25 VITALS — BP 110/78 | Temp 99.0°F | Ht 66.0 in | Wt 247.0 lb

## 2010-10-25 DIAGNOSIS — M940 Chondrocostal junction syndrome [Tietze]: Secondary | ICD-10-CM

## 2010-10-25 MED ORDER — METHYLPREDNISOLONE ACETATE 80 MG/ML IJ SUSP
120.0000 mg | Freq: Once | INTRAMUSCULAR | Status: AC
  Administered 2010-10-25: 120 mg via INTRAMUSCULAR

## 2010-10-25 NOTE — Progress Notes (Signed)
  Subjective:    Patient ID: Pamela Lopez, female    DOB: 1976/03/15, 35 y.o.   MRN: [id number]  HPI Here to follow up after an ER visit on 10-23-10 for chest pain. This has been going on for 2 weeks. She has never had this before. The pain is sharp, and it gets worse on deep breathing. No SOB or sweating or nausea or heartburn. It comes and goes, and it has no relation to exertion. Had a normal workup in the ER including d-dimer, EKG, CXR, etc. She was not given a diagnosis nor any type of treatment. She does take Ibuprofen 800mg  tid .    Review of Systems  Constitutional: Negative.   Respiratory: Negative.   Cardiovascular: Positive for chest pain. Negative for palpitations and leg swelling.  Gastrointestinal: Negative.        Objective:   Physical Exam  Constitutional: She appears well-developed and well-nourished.  Cardiovascular: Normal rate, regular rhythm, normal heart sounds and intact distal pulses.  Exam reveals no gallop and no friction rub.   No murmur heard. Pulmonary/Chest: Effort normal and breath sounds normal. No respiratory distress. She has no wheezes. She has no rales. She exhibits tenderness.       She is very tender along the lower left sternal margin   Abdominal: Soft. Bowel sounds are normal. She exhibits no distension and no mass. There is no tenderness. There is no rebound and no guarding.          Assessment & Plan:  This is costochondritis. Rest, given a note to excuse from work 10-23-10 through today. Increase Motrin to qid, given a depomedrol shot.

## 2010-10-25 NOTE — Telephone Encounter (Signed)
Noted  

## 2010-10-29 ENCOUNTER — Telehealth: Payer: Self-pay | Admitting: Family Medicine

## 2010-10-29 NOTE — Telephone Encounter (Signed)
Pt was seen on 10-25-2010 dx with inflammation in ribcage area.

## 2010-10-29 NOTE — Telephone Encounter (Signed)
Sent to er

## 2010-10-29 NOTE — Telephone Encounter (Signed)
Talked with pt and she was obviously sob- states she had been to er about 1 week ago and had inflammation in ribs,but has gotten worse and now has become sob with chest pain--was told to go to er

## 2010-11-12 ENCOUNTER — Emergency Department (HOSPITAL_COMMUNITY)
Admission: EM | Admit: 2010-11-12 | Discharge: 2010-11-12 | Disposition: A | Discharge status: Home / Self Care | Payer: BC Managed Care – PPO | Attending: Emergency Medicine | Admitting: Emergency Medicine

## 2010-11-12 ENCOUNTER — Emergency Department (HOSPITAL_COMMUNITY): Payer: BC Managed Care – PPO

## 2010-11-12 DIAGNOSIS — R0602 Shortness of breath: Secondary | ICD-10-CM | POA: Insufficient documentation

## 2010-11-12 DIAGNOSIS — R0789 Other chest pain: Secondary | ICD-10-CM | POA: Insufficient documentation

## 2010-11-12 DIAGNOSIS — J45909 Unspecified asthma, uncomplicated: Secondary | ICD-10-CM | POA: Insufficient documentation

## 2010-11-13 ENCOUNTER — Encounter: Payer: Self-pay | Admitting: Family Medicine

## 2010-11-13 ENCOUNTER — Ambulatory Visit (INDEPENDENT_AMBULATORY_CARE_PROVIDER_SITE_OTHER): Payer: BC Managed Care – PPO | Admitting: Family Medicine

## 2010-11-13 ENCOUNTER — Telehealth: Payer: Self-pay

## 2010-11-13 VITALS — BP 120/84 | HR 88 | Temp 98.7°F

## 2010-11-13 DIAGNOSIS — F419 Anxiety disorder, unspecified: Secondary | ICD-10-CM

## 2010-11-13 DIAGNOSIS — F411 Generalized anxiety disorder: Secondary | ICD-10-CM

## 2010-11-13 DIAGNOSIS — M940 Chondrocostal junction syndrome [Tietze]: Secondary | ICD-10-CM

## 2010-11-13 MED ORDER — FLUOXETINE HCL 20 MG PO CAPS: 20.0000 mg | ORAL_CAPSULE | Freq: Every day | ORAL | Status: DC

## 2010-11-13 MED ORDER — PREDNISONE (PAK) 10 MG PO TABS: ORAL_TABLET | ORAL | Status: DC

## 2010-11-13 NOTE — Telephone Encounter (Signed)
sch called and sch appt for her( CCM called her)

## 2010-11-13 NOTE — Telephone Encounter (Signed)
Pt called reported she went to ER last nite for SOB chest pain. Per pt EKG-Normal CXR -Normal  And was discharged on vicodin States also went to ER 2 wks ago  And was given ibpruprofen,  Wants appt  She says she has class today and can't miss it and she is feeling ok --pain 8 out of scale 10 Can I give her same day of tomorrow ?  Call back [phone]

## 2010-11-13 NOTE — Progress Notes (Signed)
  Subjective:    Patient ID: Pamela Lopez, female    DOB: 1976/04/02, 35 y.o.   MRN: [id number]  HPI Here to follow up on chest pains. She was in the ER again yesterday for SOB and chest pains. Her workup was normal including CXR and EKG.At her ER visit 2 weeks ago she also had a d-dimer drawn, which was negative.  She was given a nebulizer treatment which helped a bit. Today she is much the same. Taking a deep breath causes sharp anterior chest pains. No cough. She admits to being under a lot of stress at work and at home. She does not sleep well. She worries about things. She says she is the type who internalizes stress.    Review of Systems  Constitutional: Negative.   Respiratory: Positive for shortness of breath. Negative for cough, choking, chest tightness, wheezing and stridor.   Cardiovascular: Positive for chest pain. Negative for palpitations and leg swelling.  Psychiatric/Behavioral: Positive for sleep disturbance. Negative for hallucinations, confusion, dysphoric mood and agitation. The patient is nervous/anxious.        Objective:   Physical Exam  Constitutional: She appears well-developed and well-nourished.  Cardiovascular: Normal rate, regular rhythm, normal heart sounds and intact distal pulses.  Exam reveals no gallop and no friction rub.   No murmur heard. Pulmonary/Chest: Effort normal and breath sounds normal. No respiratory distress. She has no wheezes. She has no rales. She exhibits tenderness.       She is tender along both sternal margins   Psychiatric: Her behavior is normal. Judgment and thought content normal.       She is anxious and tearful           Assessment & Plan:  This is costochondritis that is complicated by a lot of anxiety. Try a steroid dose pack and start on Prozac. Recheck in 2 weeks

## 2010-11-13 NOTE — Telephone Encounter (Signed)
Any time is okay

## 2010-11-14 ENCOUNTER — Telehealth: Payer: Self-pay | Admitting: *Deleted

## 2010-11-14 NOTE — Telephone Encounter (Signed)
Please call this pt and see if Dr. Clent Ridges will honor her request for a short term disability......Marland KitchenMarland KitchenShe needs to have input from Dr. Clent Ridges re: dates and communication with her company.

## 2010-11-15 ENCOUNTER — Telehealth: Payer: Self-pay | Admitting: Family Medicine

## 2010-11-15 NOTE — Telephone Encounter (Signed)
Pt called back to check on status of getting disability forms filled. Pt has been advised that Dr Clent Ridges will complete forms, if pt has forms sent. Pt is wondering if she still needs to keep her ov for 11/27/10 or come sooner. Pls advise.

## 2010-11-15 NOTE — Telephone Encounter (Signed)
I can do this. If they send me forms, I will fill them out

## 2010-11-15 NOTE — Telephone Encounter (Signed)
Pt needs another work note from 11-12-2010 until ov on 11-27-2010 or sooner.

## 2010-11-15 NOTE — Telephone Encounter (Signed)
Pt aware and will provide the forms.

## 2010-11-16 NOTE — Telephone Encounter (Signed)
Note provided and pt aware.

## 2010-11-16 NOTE — Telephone Encounter (Signed)
Keep the OV for 11-27-10, and give her a work note until then

## 2010-11-21 ENCOUNTER — Encounter: Payer: Self-pay | Admitting: Family Medicine

## 2010-11-21 DIAGNOSIS — Z0279 Encounter for issue of other medical certificate: Secondary | ICD-10-CM

## 2010-11-24 LAB — POCT HEMOGLOBIN-HEMACUE: Hemoglobin: 13 g/dL (ref 12.0–15.0)

## 2010-11-25 LAB — POCT HEMOGLOBIN-HEMACUE: Hemoglobin: 13.4 g/dL (ref 12.0–15.0)

## 2010-11-27 ENCOUNTER — Encounter: Payer: Self-pay | Admitting: Family Medicine

## 2010-11-27 ENCOUNTER — Ambulatory Visit (INDEPENDENT_AMBULATORY_CARE_PROVIDER_SITE_OTHER): Payer: BC Managed Care – PPO | Admitting: Family Medicine

## 2010-11-27 VITALS — BP 136/90 | HR 100 | Temp 98.4°F

## 2010-11-27 DIAGNOSIS — IMO0001 Reserved for inherently not codable concepts without codable children: Secondary | ICD-10-CM

## 2010-11-27 DIAGNOSIS — R350 Frequency of micturition: Secondary | ICD-10-CM

## 2010-11-27 DIAGNOSIS — F411 Generalized anxiety disorder: Secondary | ICD-10-CM

## 2010-11-27 DIAGNOSIS — F419 Anxiety disorder, unspecified: Secondary | ICD-10-CM

## 2010-11-27 DIAGNOSIS — M940 Chondrocostal junction syndrome [Tietze]: Secondary | ICD-10-CM

## 2010-11-27 LAB — POCT URINALYSIS DIPSTICK
Clarity, UA: NEGATIVE
Nitrite, UA: NEGATIVE
Spec Grav, UA: 1.03
Urobilinogen, UA: NEGATIVE

## 2010-11-27 MED ORDER — BUPROPION HCL ER (XL) 150 MG PO TB24: 150.0000 mg | ORAL_TABLET | ORAL | Status: DC

## 2010-11-27 NOTE — Progress Notes (Signed)
  Subjective:    Patient ID: Pamela Lopez, female    DOB: 01-08-1976, 35 y.o.   MRN: [id number]  HPI Here to follo wup on costochondritis and anxiety. She has finished the steroid dose pack, and this has helped the chest pains somewhat. Her pain has come down from a 10 on a scale of 10 to a 4 or 5. She has been taking Prozac but this has not helped her anxiety much. It has actually caused some side effects like making her sluggish and "zoned out". She remains out of work. Her employers have offered her to meet with an EAP therapist, and she asks my advice. She remains out of work.    Review of Systems  Constitutional: Negative.   Respiratory: Positive for shortness of breath.   Cardiovascular: Positive for chest pain.  Psychiatric/Behavioral: Positive for sleep disturbance, dysphoric mood and decreased concentration. The patient is nervous/anxious.        Objective:   Physical Exam  Constitutional: She appears well-developed and well-nourished.  Cardiovascular: Normal rate, regular rhythm, normal heart sounds and intact distal pulses.   Pulmonary/Chest: Effort normal and breath sounds normal.       Slightly tender along the  sternum   Psychiatric: Her behavior is normal. Judgment and thought content normal.       Affect is depressed, tearful           Assessment & Plan:  The costochondritis has improved but the anxiety has not. We will switch from Prozac to Wellbutrin. Recheck in 2 weeks. I will keep her out of work until 12-12-10. I encouraged her to get involved with an EAP therapist.

## 2010-11-30 ENCOUNTER — Ambulatory Visit: Payer: BC Managed Care – PPO | Admitting: Family Medicine

## 2010-12-12 ENCOUNTER — Ambulatory Visit (INDEPENDENT_AMBULATORY_CARE_PROVIDER_SITE_OTHER): Payer: BC Managed Care – PPO | Admitting: Family Medicine

## 2010-12-12 ENCOUNTER — Encounter: Payer: Self-pay | Admitting: Family Medicine

## 2010-12-12 VITALS — BP 110/82 | HR 84 | Temp 98.6°F | Ht 66.5 in | Wt 244.0 lb

## 2010-12-12 DIAGNOSIS — F411 Generalized anxiety disorder: Secondary | ICD-10-CM

## 2010-12-12 DIAGNOSIS — F419 Anxiety disorder, unspecified: Secondary | ICD-10-CM

## 2010-12-12 DIAGNOSIS — F329 Major depressive disorder, single episode, unspecified: Secondary | ICD-10-CM

## 2010-12-12 MED ORDER — TEMAZEPAM 30 MG PO CAPS: 30.0000 mg | ORAL_CAPSULE | Freq: Every evening | ORAL | Status: AC | PRN

## 2010-12-12 NOTE — Progress Notes (Signed)
  Subjective:    Patient ID: Pamela Lopez, female    DOB: 25-Aug-1975, 35 y.o.   MRN: [id number]  HPI Here to follow up on anxiety and depression. She has been on Wellbutrin for 2 weeks, and she feels somewhat better, however she still has a lot of both to deal with. She met with Hurley Cisco last week for therapy, and she will see her weekyl. Also she set Nanna up to see Dr. Evelene Croon on 12-27-10 for a psychiatric evaluation. She is having a lot of trouble sleeping also. She remains out of work. Her chest wall pain has resolved.    Review of Systems  Constitutional: Negative.   Psychiatric/Behavioral: Positive for sleep disturbance, dysphoric mood and decreased concentration. The patient is nervous/anxious.        Objective:   Physical Exam  Constitutional: She appears well-developed and well-nourished.  Psychiatric:       Tearful, depressed affect           Assessment & Plan:  Increase Wellbutrin to 300 mg a day. Try Temazepam for sleep. She is still unable to work, so we will keep her pout until 12-27-10 when she sees Dr. Evelene Croon. From that point on Dr. Evelene Croon will determine her treatment and her work status.

## 2010-12-27 ENCOUNTER — Ambulatory Visit (HOSPITAL_COMMUNITY)
Admission: RE | Admit: 2010-12-27 | Discharge: 2010-12-27 | Disposition: A | Discharge status: Home / Self Care | Payer: PRIVATE HEALTH INSURANCE | Attending: Psychiatry | Admitting: Psychiatry

## 2010-12-27 DIAGNOSIS — F331 Major depressive disorder, recurrent, moderate: Secondary | ICD-10-CM | POA: Insufficient documentation

## 2010-12-28 ENCOUNTER — Ambulatory Visit: Payer: BC Managed Care – PPO | Admitting: Family Medicine

## 2011-01-01 ENCOUNTER — Other Ambulatory Visit (HOSPITAL_COMMUNITY): Payer: PRIVATE HEALTH INSURANCE | Admitting: Psychiatry

## 2011-01-01 NOTE — Op Note (Signed)
NAMETERRIKA, Pamela Lopez NO.:  [id number]   MEDICAL RECORD NO.:  [id number]          PATIENT TYPE:  AMB   LOCATION:  DSC                          FACILITY:  [facility]   PHYSICIAN:  Loreta Ave, M.D. DATE OF BIRTH:  [birth year]   DATE OF PROCEDURE:  03/09/2009  DATE OF DISCHARGE:                               OPERATIVE REPORT   PREOPERATIVE DIAGNOSIS:  Right carpal tunnel syndrome.   POSTOPERATIVE DIAGNOSIS:  Right carpal tunnel syndrome.   PROCEDURE:  Right carpal tunnel release.   SURGEON:  Loreta Ave, MD   ASSISTANT:  Genene Churn. Barry Dienes, [place]   ANESTHESIA:  General.   BLOOD LOSS:  Minimal.   SPECIMENS:  None.   CULTURES:  None.   COMPLICATIONS:  None.   DRESSING:  Soft compressive with a bulky hand dressing and splint.   TOURNIQUET TIME:  30 minutes.   PROCEDURE:  The patient was brought to the operating room and placed on  operating table in supine position.  After adequate anesthesia had been  obtained, tourniquet applied to upper aspect of right arm.  Prepped and  draped in usual sterile fashion.  Exsanguinated with elevation and  Esmarch.  Tourniquet inflated to 250 mmHg.  Small incision over the  volar aspect of carpal tunnel extending slightly ulnarward at the wrist  crease.  Skin and subcutaneous tissue were divided.  Retinaculum over  the carpal tunnel identified and incised under direct visualization from  the forearm fascia proximally to the palmar arch distally.  Nice  decompression of the nerve throughout.  Digital branch and motor branch  identified and protected.  I then looked proximally where she had  complained about a possible cyst at the entrance of the carpal canal.  The only structure there was a little bit of prominence of the muscle at  the musculotendinous junction in the flexor tendons on the ulnar side.  No other  abnormality or cyst identified.  Wound irrigated.  Closed with nylon.  Sterile compressive dressing applied.   Bulky hand dressing and splint.  Tourniquet deflated and removed.  Anesthesia reversed.  Brought to the  room.  Tolerated surgery well.  No complications.      Loreta Ave, M.D.  Electronically Signed     Loreta Ave, M.D.  Electronically Signed    DFM/MEDQ  D:  03/09/2009  T:  03/09/2009  Job:  [id number]

## 2011-01-01 NOTE — Op Note (Signed)
Pamela Lopez, Pamela Lopez             ACCOUNT NO.:  [id number]   MEDICAL RECORD NO.:  [id number]          PATIENT TYPE:  AMB   LOCATION:  DSC                          FACILITY:  [facility]   PHYSICIAN:  Loreta Ave, M.D. DATE OF BIRTH:  1975-09-13   DATE OF PROCEDURE:  04/13/2009  DATE OF DISCHARGE:                               OPERATIVE REPORT   PREOPERATIVE DIAGNOSES:  1. Left wrist carpal tunnel syndrome.  2. Left wrist question ganglion cyst distal volar forearm.   POSTOPERATIVE DIAGNOSES:  1. Left carpal tunnel syndrome.  2. 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   PROCEDURE:  1. Left carpal tunnel release.  2. Exploration of assumed cyst turning out to be a small fascial      hernia which was released to obliterate symptoms from that area      with a small fasciotomy.   SURGEON:  Loreta Ave, MD   ASSISTANT:  Zonia Kief, PA   ANESTHESIA:  General.   BLOOD LOSS:  Minimal.   SPECIMENS:  None.   CULTURES:  None.   COMPLICATIONS:  None.   DRESSINGS:  Soft compressive.   PROCEDURE:  The patient was brought to the operating room and placed on  the operating table in supine position.  After adequate anesthesia have  been obtained, wrist examined.  There was an area of a very non-discrete  but fullness on the volar side just proximal to carpal tunnel where  there was a question of a cyst.  This was identified just with extreme  dorsiflexion of the wrist.  Wrist was prepped and draped in usual  sterile fashion.  It was exsanguinated with elevation and Esmarch and  tourniquet inflated to 250 mmHg.  Attention turned to the carpal tunnel.  A small volar incision over the wrist curving slightly ulnar border to  the distal wrist crease.  Skin and subcutaneous tissue divided.  Retinaculum over the carpal tunnel identified and incised in the forearm  fascia proximally to the palmar arch distally.  Digital branch and motor  branch  identified and protected.  Looking under the skin back towards  the area of fullness I did not see a discrete entity but I really could  not visualize that area well.  Wound irrigated.  I made a small  longitudinal incision over the area of fullness.  Skin and subcutaneous  tissue divided.  This turned out to be a small fascial hernia allowing  the volar muscles to pooch through this area.  This was enlarged, would  not restrict and cause symptoms in that area.  Thoroughly explored and  no other cystic mass or other abnormalities were seen.  Just adjacent to  this, I was where the median nerve had been entering into the carpal  tunnel and that was protected throughout the exploration.  Wound was  irrigated and closed with nylon.  Sterile compressive dressing applied.  Short-arm splint applied.  Tourniquet deflated and removed.  Anesthesia  reversed.  Brought to the recovery room.  Tolerated surgery well.  No  complications.      Loreta Ave, M.D.  Electronically Signed     DFM/MEDQ  D:  04/13/2009  T:  04/14/2009  Job:  [id number]

## 2011-01-02 ENCOUNTER — Other Ambulatory Visit (HOSPITAL_COMMUNITY): Payer: PRIVATE HEALTH INSURANCE | Attending: Psychiatry | Admitting: Psychiatry

## 2011-01-02 DIAGNOSIS — G43909 Migraine, unspecified, not intractable, without status migrainosus: Secondary | ICD-10-CM | POA: Insufficient documentation

## 2011-01-02 DIAGNOSIS — J45909 Unspecified asthma, uncomplicated: Secondary | ICD-10-CM | POA: Insufficient documentation

## 2011-01-02 DIAGNOSIS — F331 Major depressive disorder, recurrent, moderate: Secondary | ICD-10-CM | POA: Insufficient documentation

## 2011-01-03 ENCOUNTER — Other Ambulatory Visit (HOSPITAL_COMMUNITY): Payer: PRIVATE HEALTH INSURANCE | Admitting: Psychiatry

## 2011-01-04 ENCOUNTER — Other Ambulatory Visit (HOSPITAL_COMMUNITY): Payer: PRIVATE HEALTH INSURANCE | Admitting: Psychiatry

## 2011-01-04 NOTE — Op Note (Signed)
[facility]  Patient:    Pamela Lopez, Pamela Lopez Visit Number: [id number] MRN: [id number]          Service Type: DSU Location: [facility] Attending Physician:  Maxie Better Dictated by:   Sheria Lang. Cherly Hensen, M.D. Proc. Date: 03/04/02 Admit Date:  03/04/2002 Discharge Date: 03/04/2002                             Operative Report  REOPERATIVE DIAGNOSIS:        Desires termination.  POSTOPERATIVE DIAGNOSIS:      Desires termination.  OPERATION:                    Suction, dilation and evacuation.  SURGEON:                      Sheronette A. Cherly Hensen, M.D.  ANESTHESIA:                   General; paracervical block.  INDICATIONS:                  This is a 35 year old para 0 female at 10 weeks who desires termination of pregnancy.  Risks and benefits of the procedure had been explained to the patient.  Consent was signed.  The patient was transferred to the operating room.  DESCRIPTION OF PROCEDURE:     Under adequate initially monitored anesthesia, the patient was placed in the dorsal lithotomy position.  She was sterilely prepped and draped in the usual fashion.  The bladder was catheterized for a moderate amount of urine.  Bimanual examination revealed 10 week size anteverted uterus.  No adnexal masses could be appreciated.  The patient did have a luminary in place from the day prior.  Bivalve speculum was placed in the vagina.  The luminary was removed and 20.5 cc of 1% Nesacaine was injected paracervically at 3 and 9 oclock.  The anterior lip of the cervix was grasped with a single-tooth tenaculum.  The cervix easily accepted a #29-31 Pratt dilator and a 10 mm curved suction cannula was introduced into the uterine cavity.  A large amount of POC were obtained, and the cavity was then curetted and resuctioned using the 8 mm suction cannula.  The cavity was curetted and resuctioned until all tissue was felt to be removed at which time  all instruments were then removed from the vagina.  The specimen labeled products of conception was sent to pathology.  Estimated blood loss was minimal. Complications none.  The patient tolerated the procedure well and was transferred to the recovery room in stable condition.  She will receive RhoGAM if indicated.  The patient also received Vibramycin for antibiotic prophylaxis on route to surgery.  We will follow up postoperatively using oral doxycycline. Dictated by:   Sheria Lang. Cherly Hensen, M.D. Attending Physician:  Maxie Better DD:  03/04/02 TD:  03/09/02 Job: [id number]

## 2011-01-04 NOTE — Procedures (Signed)
[place]  Patient:    Pamela Lopez, Pamela Lopez                    MRN: [id number] Proc. Date: 07/01/00 Adm. Date:  [id number] Attending:  Charna Elizabeth CC:         Meliton Rattan, M.D.   Procedure Report  DATE OF BIRTH:  Apr 22, 1976.  REFERRING PHYSICIAN:  Meliton Rattan, M.D.  PROCEDURE PERFORMED:  Esophagogastroduodenoscopy.  ENDOSCOPIST:  Anselmo Rod, M.D.  INSTRUMENT USED:  Olympus video panendoscope.  INDICATIONS FOR PROCEDURE:  Severe epigastric and right upper quadrant pain in a 34 year old African-American female rule out peptic ulcer disease.  Patient had normal abdominal ultrasound with no evidence of gallstones or gallbladder pathology.  PREPROCEDURE PREPARATION:  Informed consent was procured from the patient. The patient was fasted for eight hours prior to the procedure.  PREPROCEDURE PHYSICAL:  The patient had stable vital signs.  Neck supple. Chest clear to auscultation.  S1, S2 regular.  Abdomen soft with normal abdominal bowel sounds.  Significant epigastric and right upper quadrant tenderness on palpation, no guarding, no rebound, no rigidity, no hepatosplenomegaly, no masses palpable.  DESCRIPTION OF PROCEDURE:  The patient was placed in left lateral decubitus position and sedated with 60 mg of Demerol and 7 mg of Versed intravenously. Once the patient was adequately sedated and maintained on low-flow oxygen and continuous cardiac monitoring, the Olympus video panendoscope was advanced through the mouthpiece, over the tongue, into the esophagus under direct vision.  The entire esophagus appeared normal without evidence of ring, stricture, masses, lesions, esophagitis or Barretts mucosa.  The scope was then advanced in the stomach.  A small hiatal hernia was seen on high retroflexion.  No erosions, ulcerations, masses or polyps were seen.  The proximal small  bowel appeared normal up to 60 cm.  There was no  outlet obstruction.  The patient tolerated the procedure well without complications.  IMPRESSION: 1. The patient will be started on Nexium 40 mg 1 p.o. q.d.  Samples have been    given to her from the office for the next two weeks.   She is to follow up    in the office within the next week.  Further recommendations will be made    at that time. 2. She has been strongly advised to refrain from the use of all nonsteroidals    in the interim and to call the office if her symptoms worsen.   RECOMMENDATION: DD:  07/01/00 TD:  07/01/00 Job: [id number]

## 2011-01-04 NOTE — Op Note (Signed)
[facility]  Patient:    Pamela Lopez, Pamela Lopez Visit Number: [id number] MRN: [id number]          Service Type: DSU Location: [facility] Attending Physician:  Ermalene Searing Dictated by:   Sheria Lang Cherly Hensen, M.D. Proc. Date: 03/12/02 Admit Date:  03/12/2002                             Operative Report  PREOPERATIVE DIAGNOSIS:       Postabortal syndrome, question retained products                               of conception.  POSTOPERATIVE DIAGNOSIS:      Postabortal syndrome.  OPERATION:                    Suction dilatation and curettage.  SURGEON:                      Sheronette A. Cherly Hensen, M.D.  ANESTHESIA:                   MAC, paracervical block.  ESTIMATED BLOOD LOSS:         Minimal.  COMPLICATIONS:                None.  INDICATIONS:                  This is a 35 year old gravida 1, para 0-0-1-0, who is status post a first trimester elective termination on around March 03, 2002, who presented to the office with lower abdominal pain unresponsive to Motrin or Darvocet.  The patient had had slight increased bleeding over the last couple of days, denied any fever, and had no malodorous discharge.  Her pathology was consistent with chorionic villi.  The patients history is notable for a cryosurgery.  She underwent evaluation in the office that showed normal white count of 6.2, uterus that had a large amount of clotted material, and a right ovarian cyst, simple in appearance.  The patient was advised of the need for suction D&C in order to alleviate her symptoms.  Risks and benefits of the procedure have been explained to the patient, consent was signed, and the patient was transferred to the operating room.  DESCRIPTION OF PROCEDURE:     Under adequate anesthesia, the patient was placed in the dorsal lithotomy position.  She was sterilely prepped and draped in the usual fashion.  The bladder was catheterized for a small amount of urine.  The  patient was given antibiotics intravascularly.  Examination under anesthesia revealed an anteverted uterus about seven weeks size, boggy.  No adnexal masses could be appreciated.  The bivalve speculum was placed in the vagina.  The vagina had been prepped with Betadine solution.  Twenty cubic centimeters total 1% Nesacaine was injected at 3 and 9 oclock as well as on the anterior lip of the cervix.  The cervix was noted to have a pinpoint os with a small clot protruding through the os.  The anterior lip of the cervix was grasped with a single-tooth tenaculum.  The cervical os was serially dilated up to #27 [facility] dilator, and a #7 mm curved suction cannula was introduced into the uterine cavity.  A large amount of dark, clotted material was removed.  The cavity was gently curetted, resuctioned, and then all instruments  removed from the vagina.  The specimen labeled "questioned products of conception" was sent to pathology, although it is most likely blood.  The patient tolerated the procedure well, was transferred to recovery in stable condition. Dictated by:   Sheria Lang. Cherly Hensen, M.D. Attending Physician:  Marina Gravel B DD:  03/12/02 TD:  03/16/02 Job: [id number]

## 2011-01-04 NOTE — Op Note (Signed)
NAME:  Pamela Lopez, Pamela Lopez                       ACCOUNT NO.:  [id number]   MEDICAL RECORD NO.:  [id number]                   PATIENT TYPE:  AMB   LOCATION:  DSC                                  FACILITY:  [facility]   PHYSICIAN:  Currie Paris, M.D.           DATE OF BIRTH:  [birth year]   DATE OF PROCEDURE:  05/26/2003  DATE OF DISCHARGE:                                 OPERATIVE REPORT   OFFICE MEDICAL RECORD NUMBER:  [id number]   PREOPERATIVE DIAGNOSIS:  Subcutaneous mass, right buttock, possible fat  necrosis.   POSTOPERATIVE DIAGNOSIS:  Subcutaneous mass, right buttock, possible fat  necrosis.   OPERATION:  Excision of subcutaneous mass, right buttock.   SURGEON:  Currie Paris, M.D.   ANESTHESIA:  MAC.   CLINICAL HISTORY:  This patient is a 35 year old who has a painful mass in  the right buttock area.  She thinks this was from an injection she received  in that area in March.  She has had a small mass there that has been quite  tender, bothersome when she sits down.  Clinically, I thought this might  represent a small area of fat necrosis or some chronic inflammatory change  possibly from her injection.   DESCRIPTION OF PROCEDURE:  The patient was seen in the holding area and had  no further questions.  We identified the right buttock area as the operative  side.  She was taken to the operating room and placed left side down, and  the patient identified, and I confirmed the presence of the mass and  location and marked it.  She was then given IV sedation.  The area was  prepped and draped.  A combination of 1% Xylocaine with epinephrine and 0.5%  plain Marcaine was mixed equally and used for local.  I infiltrated over the  area.  The mass felt reasonably superficial, so I elected to make a small  ellipse of skin over it.  As I got into the subcutaneous tissue using the  knife to excise this, I entered a chronic cavity that was filled with a  yellowish material  with some what appeared to be indurated chronically-  inflamed fatty tissue around it.  This was excised completely leaving only  normal fat behind.  Clinically, I thought this did represent fat necrosis,  but got some cultures to be sure there was no some chronic infection or  inflammatory process here.   Bleeding was controlled with cautery, and then the incision closed in layers  with 4-0 Vicryl, 3-0 Monocryl subcuticular, and Dermabond.   The patient tolerated the procedure well.  There were no operative  complications, and all counts were correct.  Currie Paris, M.D.    CJS/MEDQ  D:  05/26/2003  T:  05/26/2003  Job:  [id number]

## 2011-01-07 ENCOUNTER — Other Ambulatory Visit (HOSPITAL_BASED_OUTPATIENT_CLINIC_OR_DEPARTMENT_OTHER): Payer: PRIVATE HEALTH INSURANCE | Admitting: Psychiatry

## 2011-01-07 DIAGNOSIS — F332 Major depressive disorder, recurrent severe without psychotic features: Secondary | ICD-10-CM

## 2011-01-07 DIAGNOSIS — F4310 Post-traumatic stress disorder, unspecified: Secondary | ICD-10-CM

## 2011-01-08 ENCOUNTER — Other Ambulatory Visit (HOSPITAL_COMMUNITY): Payer: PRIVATE HEALTH INSURANCE | Admitting: Psychiatry

## 2011-01-09 ENCOUNTER — Other Ambulatory Visit (HOSPITAL_COMMUNITY): Payer: PRIVATE HEALTH INSURANCE | Admitting: Psychiatry

## 2011-01-10 ENCOUNTER — Other Ambulatory Visit (HOSPITAL_COMMUNITY): Payer: PRIVATE HEALTH INSURANCE | Admitting: Psychiatry

## 2011-01-11 ENCOUNTER — Other Ambulatory Visit (HOSPITAL_COMMUNITY): Payer: PRIVATE HEALTH INSURANCE | Admitting: Psychiatry

## 2011-01-15 ENCOUNTER — Other Ambulatory Visit (HOSPITAL_COMMUNITY): Payer: PRIVATE HEALTH INSURANCE | Admitting: Psychiatry

## 2011-01-16 ENCOUNTER — Other Ambulatory Visit (HOSPITAL_COMMUNITY): Payer: PRIVATE HEALTH INSURANCE | Admitting: Psychiatry

## 2011-01-17 ENCOUNTER — Other Ambulatory Visit (HOSPITAL_COMMUNITY): Payer: PRIVATE HEALTH INSURANCE | Admitting: Psychiatry

## 2011-01-18 ENCOUNTER — Other Ambulatory Visit (HOSPITAL_COMMUNITY): Payer: PRIVATE HEALTH INSURANCE | Attending: Psychiatry | Admitting: Psychiatry

## 2011-01-18 DIAGNOSIS — F4310 Post-traumatic stress disorder, unspecified: Secondary | ICD-10-CM | POA: Insufficient documentation

## 2011-01-18 DIAGNOSIS — E669 Obesity, unspecified: Secondary | ICD-10-CM | POA: Insufficient documentation

## 2011-01-18 DIAGNOSIS — J45909 Unspecified asthma, uncomplicated: Secondary | ICD-10-CM | POA: Insufficient documentation

## 2011-01-18 DIAGNOSIS — F332 Major depressive disorder, recurrent severe without psychotic features: Secondary | ICD-10-CM | POA: Insufficient documentation

## 2011-01-18 DIAGNOSIS — G43909 Migraine, unspecified, not intractable, without status migrainosus: Secondary | ICD-10-CM | POA: Insufficient documentation

## 2011-01-21 ENCOUNTER — Other Ambulatory Visit (HOSPITAL_COMMUNITY): Payer: PRIVATE HEALTH INSURANCE | Admitting: Psychiatry

## 2011-02-23 ENCOUNTER — Other Ambulatory Visit: Payer: Self-pay | Admitting: Family Medicine

## 2011-03-09 ENCOUNTER — Ambulatory Visit (INDEPENDENT_AMBULATORY_CARE_PROVIDER_SITE_OTHER): Payer: BC Managed Care – PPO

## 2011-03-09 ENCOUNTER — Inpatient Hospital Stay (INDEPENDENT_AMBULATORY_CARE_PROVIDER_SITE_OTHER)
Admission: RE | Admit: 2011-03-09 | Discharge: 2011-03-09 | Disposition: A | Discharge status: Home / Self Care | Payer: BC Managed Care – PPO | Source: Ambulatory Visit | Attending: Emergency Medicine | Admitting: Emergency Medicine

## 2011-03-09 DIAGNOSIS — M2140 Flat foot [pes planus] (acquired), unspecified foot: Secondary | ICD-10-CM

## 2011-03-09 DIAGNOSIS — M79609 Pain in unspecified limb: Secondary | ICD-10-CM

## 2011-03-26 ENCOUNTER — Other Ambulatory Visit: Payer: Self-pay | Admitting: Podiatry

## 2011-03-26 ENCOUNTER — Ambulatory Visit
Admission: RE | Admit: 2011-03-26 | Discharge: 2011-03-26 | Disposition: A | Discharge status: Home / Self Care | Payer: BC Managed Care – PPO | Source: Ambulatory Visit | Attending: Podiatry | Admitting: Podiatry

## 2011-03-26 DIAGNOSIS — M25572 Pain in left ankle and joints of left foot: Secondary | ICD-10-CM

## 2011-04-27 ENCOUNTER — Inpatient Hospital Stay (INDEPENDENT_AMBULATORY_CARE_PROVIDER_SITE_OTHER)
Admission: RE | Admit: 2011-04-27 | Discharge: 2011-04-27 | Disposition: A | Discharge status: Home / Self Care | Payer: BC Managed Care – PPO | Source: Ambulatory Visit | Attending: Family Medicine | Admitting: Family Medicine

## 2011-04-27 DIAGNOSIS — M545 Low back pain: Secondary | ICD-10-CM

## 2011-07-10 ENCOUNTER — Ambulatory Visit: Payer: PRIVATE HEALTH INSURANCE | Admitting: Family Medicine

## 2011-07-17 ENCOUNTER — Telehealth: Payer: Self-pay | Admitting: Family Medicine

## 2011-07-17 NOTE — Telephone Encounter (Signed)
Pt would ov Friday for sore throat and congestion. Can I use sda slot?

## 2011-07-18 NOTE — Telephone Encounter (Signed)
That is okay.

## 2011-07-18 NOTE — Telephone Encounter (Signed)
Pt is sch for tomorrow °

## 2011-07-19 ENCOUNTER — Encounter: Payer: Self-pay | Admitting: Family Medicine

## 2011-07-19 ENCOUNTER — Ambulatory Visit (INDEPENDENT_AMBULATORY_CARE_PROVIDER_SITE_OTHER): Payer: BC Managed Care – PPO | Admitting: Family Medicine

## 2011-07-19 VITALS — BP 126/90 | HR 85 | Temp 98.4°F | Wt 268.0 lb

## 2011-07-19 DIAGNOSIS — J329 Chronic sinusitis, unspecified: Secondary | ICD-10-CM

## 2011-07-19 MED ORDER — AZITHROMYCIN 250 MG PO TABS: ORAL_TABLET | ORAL | Status: AC

## 2011-07-19 MED ORDER — HYDROCODONE-HOMATROPINE 5-1.5 MG/5ML PO SYRP: 5.0000 mL | ORAL_SOLUTION | ORAL | Status: DC | PRN

## 2011-07-19 NOTE — Progress Notes (Signed)
  Subjective:    Patient ID: Pamela Lopez, female    DOB: 01-Jul-1976, 35 y.o.   MRN: [id number]  HPI Here for one week of sinus pressure, PND, and a dry cough. No fever.    Review of Systems  Constitutional: Negative.   HENT: Positive for congestion, postnasal drip and sinus pressure.   Eyes: Negative.   Respiratory: Positive for cough.        Objective:   Physical Exam  Constitutional: She appears well-developed and well-nourished.  HENT:  Right Ear: External ear normal.  Left Ear: External ear normal.  Nose: Nose normal.  Mouth/Throat: Oropharynx is clear and moist. No oropharyngeal exudate.  Eyes: Conjunctivae are normal.  Neck: No thyromegaly present.  Pulmonary/Chest: Effort normal and breath sounds normal.  Lymphadenopathy:    She has no cervical adenopathy.          Assessment & Plan:  Add Mucinex

## 2011-09-10 ENCOUNTER — Ambulatory Visit (INDEPENDENT_AMBULATORY_CARE_PROVIDER_SITE_OTHER): Payer: PRIVATE HEALTH INSURANCE | Admitting: Family Medicine

## 2011-09-10 ENCOUNTER — Encounter: Payer: Self-pay | Admitting: Family Medicine

## 2011-09-10 VITALS — BP 126/80 | HR 89 | Temp 98.4°F | Wt 270.0 lb

## 2011-09-10 DIAGNOSIS — M940 Chondrocostal junction syndrome [Tietze]: Secondary | ICD-10-CM

## 2011-09-10 DIAGNOSIS — J329 Chronic sinusitis, unspecified: Secondary | ICD-10-CM

## 2011-09-10 MED ORDER — KETOROLAC TROMETHAMINE 60 MG/2ML IM SOLN
60.0000 mg | Freq: Once | INTRAMUSCULAR | Status: AC
  Administered 2011-09-10: 60 mg via INTRAMUSCULAR

## 2011-09-10 MED ORDER — AZITHROMYCIN 250 MG PO TABS: ORAL_TABLET | ORAL | Status: DC

## 2011-09-10 NOTE — Progress Notes (Signed)
  Subjective:    Patient ID: Pamela Lopez, female    DOB: [birth year], 36 y.o.   MRN: [id number]  HPI Here for 3 days of sinus symptoms and one day of chest pains. She has had typical sinus pressure with PND and a HA. Her fever has gone up to 100 degrees. Very little coughing and no ST. Then today she developed sharp pains in the chest which she has never felt before. It hurts to take a deep breath. Still no coughing and no SOB.    Review of Systems  Constitutional: Negative.   HENT: Negative.   Eyes: Negative.   Respiratory: Negative.   Cardiovascular: Positive for chest pain. Negative for palpitations and leg swelling.       Objective:   Physical Exam  Constitutional: She appears well-developed and well-nourished.  HENT:  Right Ear: External ear normal.  Left Ear: External ear normal.  Nose: Nose normal.  Mouth/Throat: Oropharynx is clear and moist. No oropharyngeal exudate.  Eyes: Conjunctivae are normal.  Neck: Neck supple. No thyromegaly present.  Cardiovascular: Normal rate, regular rhythm, normal heart sounds and intact distal pulses.   Pulmonary/Chest: Effort normal and breath sounds normal. No respiratory distress. She has no wheezes. She has no rales.       Chest wall is very tender, especially along the left sternal border  Lymphadenopathy:    She has no cervical adenopathy.          Assessment & Plan:  This is costochondritis that has developed from a viral infection. This has also started a sinusitis. Treat with a Zpack and a Toradol shot. She may use 800 mg of Motrin q 6 hours at home prn

## 2011-09-10 NOTE — Progress Notes (Signed)
Addended by: Aniceto Boss A on: 09/10/2011 05:08 PM   Modules accepted: Orders

## 2012-04-15 ENCOUNTER — Other Ambulatory Visit (INDEPENDENT_AMBULATORY_CARE_PROVIDER_SITE_OTHER): Payer: BC Managed Care – PPO

## 2012-04-15 DIAGNOSIS — Z0000 Encounter for general adult medical examination without abnormal findings: Secondary | ICD-10-CM

## 2012-04-15 DIAGNOSIS — E538 Deficiency of other specified B group vitamins: Secondary | ICD-10-CM

## 2012-04-15 LAB — BASIC METABOLIC PANEL
BUN: 8 mg/dL (ref 6–23)
Calcium: 9.1 mg/dL (ref 8.4–10.5)
GFR: 97.89 mL/min (ref >60.00)
Glucose, Bld: 92 mg/dL (ref 70–99)
Potassium: 3.7 mEq/L (ref 3.5–5.1)
Sodium: 136 mEq/L (ref 135–145)

## 2012-04-15 LAB — LIPID PANEL
HDL: 51.3 mg/dL (ref >39.00)
LDL Cholesterol: 105 mg/dL — ABNORMAL HIGH (ref 0–99)
Total CHOL/HDL Ratio: 3
Triglycerides: 89 mg/dL (ref 0.0–149.0)
VLDL: 17.8 mg/dL (ref 0.0–40.0)

## 2012-04-15 LAB — POCT URINALYSIS DIPSTICK
Bilirubin, UA: NEGATIVE
Blood, UA: NEGATIVE
Glucose, UA: NEGATIVE
Ketones, UA: NEGATIVE
Spec Grav, UA: 1.015
pH, UA: 6.5

## 2012-04-15 LAB — CBC WITH DIFFERENTIAL/PLATELET
Basophils Absolute: 0 10*3/uL (ref 0.0–0.1)
Eosinophils Relative: 1.2 % (ref 0.0–5.0)
HCT: 38.3 % (ref 36.0–46.0)
Hemoglobin: 12.6 g/dL (ref 12.0–15.0)
Lymphocytes Relative: 32.3 % (ref 12.0–46.0)
Lymphs Abs: 1.6 10*3/uL (ref 0.7–4.0)
Monocytes Relative: 8.6 % (ref 3.0–12.0)
Platelets: 205 10*3/uL (ref 150.0–400.0)
RDW: 13.8 % (ref 11.5–14.6)
WBC: 4.8 10*3/uL (ref 4.5–10.5)

## 2012-04-15 LAB — HEPATIC FUNCTION PANEL
ALT: 21 U/L (ref 0–35)
AST: 27 U/L (ref 0–37)
Alkaline Phosphatase: 52 U/L (ref 39–117)
Total Bilirubin: 0.7 mg/dL (ref 0.3–1.2)

## 2012-04-15 LAB — VITAMIN B12: Vitamin B-12: 1288 pg/mL — ABNORMAL HIGH (ref 211–911)

## 2012-04-16 ENCOUNTER — Ambulatory Visit (INDEPENDENT_AMBULATORY_CARE_PROVIDER_SITE_OTHER): Payer: BC Managed Care – PPO | Admitting: Obstetrics and Gynecology

## 2012-04-16 ENCOUNTER — Encounter: Payer: Self-pay | Admitting: Obstetrics and Gynecology

## 2012-04-16 VITALS — BP 110/70 | Temp 98.6°F | Wt 255.0 lb

## 2012-04-16 DIAGNOSIS — N898 Other specified noninflammatory disorders of vagina: Secondary | ICD-10-CM

## 2012-04-16 DIAGNOSIS — B356 Tinea cruris: Secondary | ICD-10-CM

## 2012-04-16 DIAGNOSIS — N390 Urinary tract infection, site not specified: Secondary | ICD-10-CM

## 2012-04-16 LAB — POCT URINALYSIS DIPSTICK
Blood, UA: NEGATIVE
Glucose, UA: NEGATIVE
Protein, UA: NEGATIVE
Spec Grav, UA: 1.015
Urobilinogen, UA: NEGATIVE
pH, UA: 6

## 2012-04-16 LAB — POCT WET PREP (WET MOUNT): Trichomonas Wet Prep HPF POC: NEGATIVE

## 2012-04-16 NOTE — Progress Notes (Signed)
35 YO complains of pruritic vaginal discharge and itching in perineal area in general with onset of summer.    O: Pelvic:EGBUS-wnl, vagina-normal, cervix-normal, uterus-normal size, without tenderness, adnexae-no tenderness  U/A-negative Wet Prep:  pH-4.5,  whiff- negative otherwise negative  A: Physiologic Discharge     Tinea Cruris  P: Patient to continue perineal hygiene & use OTC antifungal for tinea      Offered referral to dermatologist-declines for now,may want later      RTO-Dec. 2013 AEx  Dionisio Aragones, PA-C

## 2012-04-16 NOTE — Patient Instructions (Signed)
Add Vitamin D test to labs by family doctor

## 2012-04-16 NOTE — Progress Notes (Signed)
Color: white Odor: yes Itching:no she has itching in her inner thighs Thin:no Thick:yes Fever:no Dyspareunia:no Hx PID:no HX STD:yes Pelvic Pain:no Desires Gc/CT:no Desires HIV,RPR,HbsAG:no

## 2012-04-22 ENCOUNTER — Encounter: Payer: Self-pay | Admitting: Family Medicine

## 2012-04-22 ENCOUNTER — Ambulatory Visit (INDEPENDENT_AMBULATORY_CARE_PROVIDER_SITE_OTHER): Payer: BC Managed Care – PPO | Admitting: Family Medicine

## 2012-04-22 VITALS — BP 118/78 | HR 81 | Temp 98.9°F | Ht 66.5 in | Wt 258.0 lb

## 2012-04-22 DIAGNOSIS — Z23 Encounter for immunization: Secondary | ICD-10-CM

## 2012-04-22 DIAGNOSIS — Z0000 Encounter for general adult medical examination without abnormal findings: Secondary | ICD-10-CM

## 2012-04-22 MED ORDER — ALBUTEROL SULFATE HFA 108 (90 BASE) MCG/ACT IN AERS: 2.0000 | INHALATION_SPRAY | RESPIRATORY_TRACT | Status: DC | PRN

## 2012-04-22 MED ORDER — PROMETHAZINE HCL 25 MG PO TABS: 25.0000 mg | ORAL_TABLET | Freq: Four times a day (QID) | ORAL | Status: DC | PRN

## 2012-04-22 NOTE — Addendum Note (Signed)
Addended by: Aniceto Boss A on: 04/22/2012 12:00 PM   Modules accepted: Orders

## 2012-04-22 NOTE — Progress Notes (Signed)
  Subjective:    Patient ID: Pamela Lopez, female    DOB: 1975-10-20, 36 y.o.   MRN: [id number]  HPI 36 yr old female for a cpx. She feels well except for some chronic fatigue. Her recent labs were unremarkable. She has been on Topamax for several years and her migraines are well controlled.    Review of Systems  Constitutional: Positive for fatigue. Negative for fever, chills, diaphoresis, activity change, appetite change and unexpected weight change.  HENT: Negative.   Eyes: Negative.   Respiratory: Negative.   Cardiovascular: Negative.   Gastrointestinal: Negative.   Genitourinary: Negative for dysuria, urgency, frequency, hematuria, flank pain, decreased urine volume, enuresis, difficulty urinating, pelvic pain and dyspareunia.  Musculoskeletal: Negative.   Skin: Negative.   Neurological: Negative.   Hematological: Negative.   Psychiatric/Behavioral: Negative.        Objective:   Physical Exam  Constitutional: She is oriented to person, place, and time. She appears well-developed and well-nourished. No distress.  HENT:  Head: Normocephalic and atraumatic.  Right Ear: External ear normal.  Left Ear: External ear normal.  Nose: Nose normal.  Mouth/Throat: Oropharynx is clear and moist. No oropharyngeal exudate.  Eyes: Conjunctivae and EOM are normal. Pupils are equal, round, and reactive to light. No scleral icterus.  Neck: Normal range of motion. Neck supple. No JVD present. No thyromegaly present.  Cardiovascular: Normal rate, regular rhythm, normal heart sounds and intact distal pulses.  Exam reveals no gallop and no friction rub.   No murmur heard. Pulmonary/Chest: Effort normal and breath sounds normal. No respiratory distress. She has no wheezes. She has no rales. She exhibits no tenderness.  Abdominal: Soft. Bowel sounds are normal. She exhibits no distension and no mass. There is no tenderness. There is no rebound and no guarding.  Musculoskeletal: Normal range of  motion. She exhibits no edema and no tenderness.  Lymphadenopathy:    She has no cervical adenopathy.  Neurological: She is alert and oriented to person, place, and time. She has normal reflexes. No cranial nerve deficit. She exhibits normal muscle tone. Coordination normal.  Skin: Skin is warm and dry. No rash noted. No erythema.  Psychiatric: She has a normal mood and affect. Her behavior is normal. Judgment and thought content normal.          Assessment & Plan:  Well exam. She may be having side effects of Topamax, so I asked her to discuss the possibility of tapering down on this with her HA doctor. Get more exercise.

## 2012-04-23 ENCOUNTER — Telehealth: Payer: Self-pay | Admitting: Obstetrics and Gynecology

## 2012-04-23 NOTE — Progress Notes (Signed)
Quick Note:  I spoke with pt ______ 

## 2012-05-22 ENCOUNTER — Telehealth: Payer: Self-pay | Admitting: Obstetrics and Gynecology

## 2012-05-22 MED ORDER — FLUCONAZOLE 150 MG PO TABS: 150.0000 mg | ORAL_TABLET | Freq: Once | ORAL | Status: DC

## 2012-05-22 NOTE — Telephone Encounter (Signed)
TC TO PT REGARDING REQUEST FOR DIFLUCAN. INFORMED PT THAT I WILL SEND IN RX FOR DIFLUCAN BUT IF SX CONTINUES SHE WILL NEED TO SCHEDULE AN APPT.PT VOICED UNDERSTANDING. ASKED PT ABOUT HER ALLERGIES VERSES THE MED AND PT STATES THAT EP HAS GIVING PT THE MED BEFORE WITH NO PROBLEMS.

## 2012-05-22 NOTE — Telephone Encounter (Signed)
VM from pt. Requesting Rx for Diflucan.  Tried Refresh with no relief.  VAg D/C and itching.  PT [phone]

## 2012-06-19 ENCOUNTER — Telehealth: Payer: Self-pay | Admitting: Obstetrics and Gynecology

## 2012-06-19 NOTE — Telephone Encounter (Signed)
Tc TO PT.  States has been having heavy bleeding and cramping with menses x 3 months.  Changes tampon about every hour x 3 days. LMP 06/18/12. Pt offered appt 06/22/12 but declines until 06/26/12.   Suggested Ibuprofen 600 mg q 6 hr x 24 h.  To call after hours if bleeding continues or increases or with any dizziness or with any concerns.  Pt verbalizes comprehension.

## 2012-06-26 ENCOUNTER — Encounter: Payer: Self-pay | Admitting: Obstetrics and Gynecology

## 2012-06-26 ENCOUNTER — Encounter: Payer: BC Managed Care – PPO | Admitting: Obstetrics and Gynecology

## 2012-06-26 ENCOUNTER — Ambulatory Visit (INDEPENDENT_AMBULATORY_CARE_PROVIDER_SITE_OTHER): Payer: BC Managed Care – PPO | Admitting: Obstetrics and Gynecology

## 2012-06-26 VITALS — BP 122/76 | Temp 98.7°F | Wt 249.0 lb

## 2012-06-26 DIAGNOSIS — IMO0002 Reserved for concepts with insufficient information to code with codable children: Secondary | ICD-10-CM

## 2012-06-26 DIAGNOSIS — R109 Unspecified abdominal pain: Secondary | ICD-10-CM

## 2012-06-26 DIAGNOSIS — N946 Dysmenorrhea, unspecified: Secondary | ICD-10-CM

## 2012-06-26 DIAGNOSIS — Z113 Encounter for screening for infections with a predominantly sexual mode of transmission: Secondary | ICD-10-CM

## 2012-06-26 DIAGNOSIS — N920 Excessive and frequent menstruation with regular cycle: Secondary | ICD-10-CM

## 2012-06-26 NOTE — Addendum Note (Signed)
Addended byWinfred Leeds on: 06/26/2012 12:31 PM   Modules accepted: Orders

## 2012-06-26 NOTE — Progress Notes (Signed)
36 YO reports that for the past 3 months her  7 day  menstrual flow (formerly 5 day)  has been heavier requiring a pad change every 1-3 hours.  Also reports clots and  cramping that is rated 9/10 on a 10 point pain scale with no relief from Midol or Ibuprofen.  Denies intermenstrual bleeding except at the outset had 3 days of clotting about 1 week after period. Denies dyspareunia, urinary tract/bowel symptoms or vaginitis symptoms. Patient had normal TSH, CBC and BMET 04/15/12 but verbally reports that her Vitamin D is low and is currently taking Vitamin D supplementation per Dr. Clent Ridges.  O: Abdomen: soft, diffusely tender without guarding      Pelvic: EGBUS-wnl, vagina-normal but tender inside to touch with bi-manual exam, cervix-no tenderness or lesions, uterus-tender but appears      normal size (exam limited by habitus), adnexae-normal       UPT-negative      U/A-negative  A: Menorrhagia     Dyspareunia     Dysmenorrhea  P:  Pelvic U/S-pending       GC/CT-pending       RTO- for ultrasound  Rien Marland, PA-C

## 2012-06-26 NOTE — Progress Notes (Signed)
When did bleeding start:3 months How  Long: with cycle for 3 months How often changing pad/tampon: when cycle is on; the first 3 days change pads every one to three hours Bleeding Disorders: no Cramping: yes Contraception: no pt use condoms Fibroids: no Hormone Therapy: no New Medications: no Menopausal Symptoms: no Vag. Discharge: no Abdominal Pain: no Increased Stress: yes

## 2012-06-29 LAB — GC/CHLAMYDIA PROBE AMP
CT Probe RNA: NEGATIVE
GC Probe RNA: NEGATIVE

## 2012-07-06 ENCOUNTER — Ambulatory Visit (INDEPENDENT_AMBULATORY_CARE_PROVIDER_SITE_OTHER): Payer: BC Managed Care – PPO

## 2012-07-06 ENCOUNTER — Encounter: Payer: Self-pay | Admitting: Obstetrics and Gynecology

## 2012-07-06 ENCOUNTER — Ambulatory Visit (INDEPENDENT_AMBULATORY_CARE_PROVIDER_SITE_OTHER): Payer: BC Managed Care – PPO | Admitting: Obstetrics and Gynecology

## 2012-07-06 VITALS — BP 114/72 | Wt 246.0 lb

## 2012-07-06 DIAGNOSIS — N83299 Other ovarian cyst, unspecified side: Secondary | ICD-10-CM

## 2012-07-06 DIAGNOSIS — N83209 Unspecified ovarian cyst, unspecified side: Secondary | ICD-10-CM

## 2012-07-06 DIAGNOSIS — N3010 Interstitial cystitis (chronic) without hematuria: Secondary | ICD-10-CM | POA: Insufficient documentation

## 2012-07-06 DIAGNOSIS — R102 Pelvic and perineal pain: Secondary | ICD-10-CM

## 2012-07-06 DIAGNOSIS — N920 Excessive and frequent menstruation with regular cycle: Secondary | ICD-10-CM

## 2012-07-06 DIAGNOSIS — N949 Unspecified condition associated with female genital organs and menstrual cycle: Secondary | ICD-10-CM

## 2012-07-06 MED ORDER — HYDROCODONE-ACETAMINOPHEN 5-300 MG PO TABS: 1.0000 | ORAL_TABLET | Freq: Four times a day (QID) | ORAL | Status: DC

## 2012-07-06 MED ORDER — URIBEL 118 MG PO CAPS: 1.0000 | ORAL_CAPSULE | Freq: Three times a day (TID) | ORAL | Status: DC

## 2012-07-06 NOTE — Progress Notes (Signed)
36 YO with menorrhagia but normal labs  (CBC/TSH) returns for ultrasound.  O: U/S:uterus-7.73 x 4.66 x 3.78 cm with normal  endometrial cavity shape per 3D imaging-0/762 cm; right ovary-3.89 x1.95 x 2.78 cm and resolving corpus      luteum 2.1 x 1.7 x 1.8 cm and left ovary-3.35 x 1.80 x 1.61 cm.   A: Pelvic Pain     H/O Interstitial Cystitis   P:  Reviewed causes of pelvic pain: urogenital, previous surgery, gastrointestinal and musculoskeletal.       Reviewed interstitial cystitis and offered referral back to Urologist or a trial of medicaiton to see if pain      resolves.  Patient to try samples or Uribel  #6 tid x 2 day,  if pain resolves, will be given Elmiron for management      of IC;  if symptoms don't resolve, will follow up again with Dr. Clent Ridges or [facility] M.D. for further management.       RTO-as scheduled for repeat U/S or prn  Jessic Standifer, PA-C

## 2012-07-06 NOTE — Patient Instructions (Addendum)
Interstitial Cystitis Interstitial cystitis (IC) is a condition that results in discomfort or pain in the bladder and the surrounding pelvic region. The symptoms can be different from case to case and even in the same individual. People may experience:  Mild discomfort.  Pressure.  Tenderness.  Intense pain in the bladder and pelvic area. CAUSES  Because IC varies so much in symptoms and severity, people studying this disease believe it is not one but several diseases. Some caregivers use the term painful bladder syndrome (PBS) to describe cases with painful urinary symptoms. This may not meet the strictest definition of IC. The term IC / PBS includes all cases of urinary pain that cannot be connected to other causes, such as infection or urinary stones.  SYMPTOMS  Symptoms may include:  An urgent need to urinate.  A frequent need to urinate.  A combination of these symptoms. Pain may change in intensity as the bladder fills with urine or as it empties. Women's symptoms often get worse during menstruation. They may sometimes experience pain with vaginal intercourse. Some of the symptoms of IC / PBS seem like those of bacterial infection. Tests do not show infection. IC / PBS is far more common in women than in men.  DIAGNOSIS  The diagnosis of IC / PBS is based on:  Presence of pain related to the bladder, usually along with problems of frequency and urgency.  Not finding other diseases that could cause the symptoms.  Diagnostic tests that help rule out other diseases include:  Urinalysis.  Urine culture.  Cystoscopy.  Biopsy of the bladder wall.  Distension of the bladder under anesthesia.  Urine cytology.  Laboratory examination of prostate secretions. A biopsy is a tissue sample that can be looked at under a microscope. Samples of the bladder and urethra may be removed during a cystoscopy. A biopsy helps rule out bladder cancer. TREATMENT  Scientists have not yet found  a cure for IC / PBS. Patients with IC / PBS do not get better with antibiotic therapy. Caregivers cannot predict who will respond best to which treatment. Symptoms may disappear without explanation. Disappearing symptoms may coincide with an event such as a change in diet or treatment. Even when symptoms disappear, they may return after days, weeks, months, or years.  Because the causes of IC / PBS are unknown, current treatments are aimed at relieving symptoms. Many people are helped by one or a combination of the treatments. As researchers learn more about IC / PBS, the list of potential treatments will change. Patients should discuss their options with a caregiver. SURGERY  Surgery should be considered only if all available treatments have failed and the pain is disabling. Many approaches and techniques are used. Each approach has its own advantages and complications. Advantages and complications should be discussed with a urologist. Your caregiver may recommend consulting another urologist for a second opinion. Most caregivers are reluctant to operate because the outcome is unpredictable. Some people still have symptoms after surgery.  People considering surgery should discuss the potential risks and benefits, side effects, and long- and short-term complications with their family, as well as with people who have already had the procedure. Surgery requires anesthesia, hospitalization, and in some cases weeks or months of recovery. As the complexity of the procedure increases, so do the chances for complications and for failure. HOME CARE INSTRUCTIONS   All drugs, even those sold over the counter, have side effects. Patients should always consult a caregiver before using any   drug for an extended amount of time. Only take over-the-counter or prescription medicines for pain, discomfort, or fever as directed by your caregiver.  Many patients feel that smoking makes their symptoms worse. How the by-products  of tobacco that are excreted in the urine affect IC / PBS is unknown. Smoking is the major known cause of bladder cancer. One of the best things smokers can do for their bladder and their overall health is to quit.  Many patients feel that gentle stretching exercises help relieve IC / PBS symptoms.  Methods vary, but basically patients decide to empty their bladder at designated times and use relaxation techniques and distractions to keep to the schedule. Gradually, patients try to lengthen the time between scheduled voids. A diary in which to record voiding times is usually helpful in keeping track of progress. MAKE SURE YOU:   Understand these instructions.  Will watch your condition.  Will get help right away if you are not doing well or get worse. Document Released: 04/05/2004 Document Revised: 10/28/2011 Document Reviewed: 06/20/2008 [facility] Patient Information 2013 [place], [place].  Ovarian Cyst An ovarian cyst is a sac filled with fluid or blood. This sac is attached to the ovary. Some cysts go away on their own. Other cysts need treatment.  HOME CARE   Only take medicine as told by your doctor.  Follow up with your doctor as told. GET HELP RIGHT AWAY IF:   You develop sudden pain.  Your belly (abdomen) becomes large or puffy (swollen).  You have a hard time peeing (totally emptying your bladder).  You feel sick most of the time.  You have a temperature by mouth above 102 F (38.9 C), not controlled by medicine.  Your periods are late, not regular, or painful.  Your belly or pelvic pain does not go away.  You have pressure on your bladder.  You have pain during sex.  You feel fullness, pressure, or discomfort in your belly.  You lose weight for no reason. MAKE SURE YOU:   Understand these instructions.  Will watch your condition.  Will get help right away if you are not doing well or get worse. Document Released: 01/22/2008 Document Revised: 10/28/2011  Document Reviewed: 07/07/2009 [facility] Patient Information 2013 [place], [place].

## 2012-07-07 ENCOUNTER — Telehealth: Payer: Self-pay | Admitting: Obstetrics and Gynecology

## 2012-07-07 NOTE — Telephone Encounter (Signed)
EP, please see note, would you like [place] to tell the pt anything?

## 2012-07-08 NOTE — Telephone Encounter (Signed)
Patient with a history of interstitial cystitis with good response from bladder anesthetic to be prescribed Elmiron 100 mg #90 1 po tid  with water, 1 hour before or 2 hours after meals with 5 refills.  Patient may also be advised that cranberry juice irritates the bladder of those who have interstitial cystitis therefore should not be consumed. Aleigh Grunden, PA-C

## 2012-07-08 NOTE — Telephone Encounter (Signed)
This pt has questions for you about why she can not drink cranberry juice and pt states that the med is working  A little,but she still have discomfort. Please call this pt .      Pamela Lopez

## 2012-07-10 ENCOUNTER — Telehealth: Payer: Self-pay

## 2012-07-10 MED ORDER — PENTOSAN POLYSULFATE SODIUM 100 MG PO CAPS: ORAL_CAPSULE | ORAL | Status: DC

## 2012-07-10 NOTE — Telephone Encounter (Signed)
ALREADY TAKING CARE OF.

## 2012-07-10 NOTE — Telephone Encounter (Signed)
TC TO PT REGARDING MESSAGE. PT WANTED TO LET EP KNOW THAT THE URIBEL WAS WORKING BUT SHE STILL HAD A LITTLE DISCOMFORT AND THE PT WANTED TO KNOW WHY SHE COULD NOT DRINK CRANBERRY JUICE. INFORMED PT PER EP THAT THE CRANBERRY JUICE IRRITATES THE BLADDER WITH PEOPLE THAT HAVE IC. ALSO, FOR THE DISCOMFORT PT IS HAVING EP WANT PT TO TRY ELMIRON 100 MG THREE X A DAY ONE HOUR BEFORE OR 2 HOURS AFTER A MEAL. WILL SEND IN RX AND PT VOICED UNDERSTANDING.

## 2012-08-03 ENCOUNTER — Telehealth: Payer: Self-pay | Admitting: Obstetrics and Gynecology

## 2012-08-03 NOTE — Telephone Encounter (Signed)
Tc to pt per telephone call. Appt sched 08/05/12 @ 4:30 with VL for eval. Pt agrees.

## 2012-08-05 ENCOUNTER — Ambulatory Visit: Payer: BC Managed Care – PPO | Admitting: Obstetrics and Gynecology

## 2012-08-05 ENCOUNTER — Encounter: Payer: Self-pay | Admitting: Obstetrics and Gynecology

## 2012-08-05 VITALS — BP 110/70 | Resp 16 | Wt 240.0 lb

## 2012-08-05 DIAGNOSIS — Z0000 Encounter for general adult medical examination without abnormal findings: Secondary | ICD-10-CM

## 2012-08-05 DIAGNOSIS — Z87440 Personal history of urinary (tract) infections: Secondary | ICD-10-CM

## 2012-08-05 DIAGNOSIS — R102 Pelvic and perineal pain: Secondary | ICD-10-CM

## 2012-08-05 DIAGNOSIS — N898 Other specified noninflammatory disorders of vagina: Secondary | ICD-10-CM

## 2012-08-05 LAB — POCT URINALYSIS DIPSTICK
Bilirubin, UA: NEGATIVE
Blood, UA: NEGATIVE
Ketones, UA: NEGATIVE
pH, UA: 7

## 2012-08-05 LAB — POCT WET PREP (WET MOUNT)
Bacteria Wet Prep HPF POC: NEGATIVE
Clue Cells Wet Prep Whiff POC: NEGATIVE
WBC, Wet Prep HPF POC: NEGATIVE

## 2012-08-05 MED ORDER — FLUCONAZOLE 150 MG PO TABS: 150.0000 mg | ORAL_TABLET | Freq: Once | ORAL | Status: DC

## 2012-08-05 NOTE — Progress Notes (Unsigned)
Vaginal discharge: whitethick mucoid Itching / Burning: no Fever: no  Symptoms have been present for 1 weeks. Has used over-the-counter treatment: no Associated symptoms:  Pelvic pain: yes       Dyspareunia: no     Odor:  yes  History of STD:  no history of PID, STD's STD screen:requested  Currently taking Urbel due to recent diagnosis of "something" cystitis per pt

## 2012-08-06 LAB — HEPATITIS C ANTIBODY: HCV Ab: NEGATIVE

## 2012-08-06 LAB — RPR

## 2012-08-06 LAB — GC/CHLAMYDIA PROBE AMP
CT Probe RNA: NEGATIVE
GC Probe RNA: NEGATIVE

## 2012-08-10 ENCOUNTER — Telehealth: Payer: Self-pay | Admitting: Obstetrics and Gynecology

## 2012-08-10 NOTE — Telephone Encounter (Signed)
Message copied by Mason Jim on Mon Aug 10, 2012 10:04 AM ------      Message from: Cornelius Moras      Created: Sun Aug 09, 2012 11:09 PM      Regarding: Results       Please notify patient of normal test results and of the plan for any abnormal results.      Normal:  STD testing of HSV 2, HIV, RPR, Hep B and C, GC, chlamydia.      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      Plan:   No f/u needed            Thanks!      VL

## 2012-08-10 NOTE — Telephone Encounter (Signed)
TC to pt . Per VL,

## 2012-08-10 NOTE — Telephone Encounter (Signed)
TC  To pt. Per VL informed of results. States still has some vag D/C but menses has started.  To call if sx remains after menses for additonal Rx per plan by VL per pt.

## 2012-08-10 NOTE — Progress Notes (Unsigned)
Vaginal Discharge/Discomfort/Itching  Subjective:   Pamela Lopez is an 36 y.o. woman who presents c/o thick vaginal d/c and itching.  Patient has often noted increased d/c, with several evaluations recently for menorrhagia with EP.  Also has been diagnosed with interstitial cystitis--patient did have questions about that diagnosis and implications.  Requests STD testing.  Objective: discharge white, thin. Vaginal lesions:  None  Wet prep: vaginal pH is 5, with few yeast spores noted.  Otherwise negative. OSOM BV: not done OSOM Trichomonas:  not done  Assessment: Mild yeast infection IC Requests STD testing.  Plan: Medications: Diflucan 150 mg po x 1 (patient requested in lieu of Terazol).   Counseling:  Hygiene reviewed with avoidance of perfume, fragrance, soap, douche, feminine wash. Use unscented all-cotton sanitary pads or tampons. Use dye and fragrance free laundry detergents for underwear. Avoid Chlorox and dryer sheets. Sleep without underwear. Avoid prolonged wear of wet bathing suit or sweat-soaked tight clothes.  HIV, RPR, Hep B and C, GC, chlamydia, HSV 1&2 done.   Follow-up: Has f/u appt with EP on 09/01/12 for pelvic US. Will f/u with patient regarding results of STD testing.  Nigel Bridgeman, CNM 08/10/2012 10:07 AM

## 2012-08-10 NOTE — Telephone Encounter (Signed)
Message copied by Mason Jim on Mon Aug 10, 2012 10:01 AM ------      Message from: Cornelius Moras      Created: Sun Aug 09, 2012 11:09 PM      Regarding: Results       Please notify patient of normal test results and of the plan for any abnormal results.      Normal:  STD testing of HSV 2, HIV, RPR, Hep B and C, GC, chlamydia.      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      Plan:   No f/u needed            Thanks!      VL

## 2012-08-21 ENCOUNTER — Encounter: Payer: BC Managed Care – PPO | Admitting: Obstetrics and Gynecology

## 2012-08-21 ENCOUNTER — Other Ambulatory Visit: Payer: BC Managed Care – PPO

## 2012-08-21 ENCOUNTER — Telehealth: Payer: Self-pay | Admitting: Obstetrics and Gynecology

## 2012-08-21 MED ORDER — TERCONAZOLE 0.4 % VA CREA: TOPICAL_CREAM | VAGINAL | Status: DC

## 2012-08-21 NOTE — Telephone Encounter (Signed)
Addendum to 08/21/12 telephone call-Pt has an allergy to Miconazole causing vaginal burning;however pt states,"has tried Terazol in the past without any reaction". Pt opts to proceed with Terazol. Rx e-pres to pharm on file. Pt agrees.

## 2012-08-27 ENCOUNTER — Other Ambulatory Visit: Payer: BC Managed Care – PPO

## 2012-09-01 ENCOUNTER — Encounter: Payer: Self-pay | Admitting: Obstetrics and Gynecology

## 2012-09-01 ENCOUNTER — Ambulatory Visit: Payer: BC Managed Care – PPO

## 2012-09-01 ENCOUNTER — Ambulatory Visit: Payer: BC Managed Care – PPO | Admitting: Obstetrics and Gynecology

## 2012-09-01 VITALS — BP 114/70 | Temp 99.1°F | Wt 240.0 lb

## 2012-09-01 DIAGNOSIS — R102 Pelvic and perineal pain: Secondary | ICD-10-CM

## 2012-09-01 DIAGNOSIS — N920 Excessive and frequent menstruation with regular cycle: Secondary | ICD-10-CM

## 2012-09-01 DIAGNOSIS — N83299 Other ovarian cyst, unspecified side: Secondary | ICD-10-CM

## 2012-09-01 DIAGNOSIS — N3010 Interstitial cystitis (chronic) without hematuria: Secondary | ICD-10-CM

## 2012-09-01 DIAGNOSIS — Z309 Encounter for contraceptive management, unspecified: Secondary | ICD-10-CM

## 2012-09-01 DIAGNOSIS — N83209 Unspecified ovarian cyst, unspecified side: Secondary | ICD-10-CM

## 2012-09-01 LAB — HCG, QUANTITATIVE, PREGNANCY: hCG, Beta Chain, Quant, S: <2 m[IU]/mL

## 2012-09-01 NOTE — Progress Notes (Signed)
37 YO seen in November 2013 with a right ovarian cyst returns for follow up ultrasound.  Patient still complains of pelvic discomfort but has had significant relief with Elmiron.  States that cost is prohibited so will be stopping it soon.  Gave handout on foods to avoid for patient's with interstitial cystitis along with supplemental therapies  (i.e. freeze-dried aloe and an herbal compound CystoPak.   O: uterus-6.28 x5.09 x 3.99 cm with normal ovaries with right ovary containing a resolving follicle/corpus luteum;  within the endometrium there is a hyporechoic structure with a rim in the left fundal position with ? MSD 5.6 mm  ? gestational sac  [id number]  A: Ultrasound follow-up     Resolved Right Ovarian Cyst     Endometrial mass-R/O gestational sac   P: BHCG-pending      Reviewed revised guidelines for PAP smears and patient will wait to do her next PAP at year's end      RTO-as scheduled or prn  Farida Mcreynolds, PA-C

## 2012-09-02 ENCOUNTER — Telehealth: Payer: Self-pay

## 2012-09-02 NOTE — Telephone Encounter (Signed)
Message copied by Winfred Leeds on Wed Sep 02, 2012  9:11 AM ------      Message from: Henreitta Leber      Created: Tue Sep 01, 2012  7:20 PM       Please notify patient of negative HCG.  Thanks,  EP

## 2012-09-02 NOTE — Telephone Encounter (Signed)
Tc to pt to let her know that her quant was negative. Advised pt that Elmira will consult with a provider and if she need to call pt back she will. Pt ask me about Hsv 1 and I let her know what it was. Pt voiced understanding.

## 2012-09-14 ENCOUNTER — Telehealth: Payer: Self-pay | Admitting: Obstetrics and Gynecology

## 2012-09-14 NOTE — Telephone Encounter (Signed)
Patient with a request to resume BCPs may be given Sprinted #1 1 po qd with next period and use back up method for the first pack of pills.  Jaimya Feliciano, PA-C

## 2012-09-14 NOTE — Telephone Encounter (Signed)
Spoke with pt rgd msg pt states wants to inform Ep she wants to start bc lmp 09/05/12 no recent intercourse advised pt will consult with ep and call her back pt voice understanding

## 2012-09-15 NOTE — Telephone Encounter (Signed)
Spoke with pt rgd msg informed per EP can have rx for bc sprintec pt [facility] [phone] informed pt rx called to pharm pt voice understanding rx called to pharm

## 2012-09-16 ENCOUNTER — Ambulatory Visit: Payer: BC Managed Care – PPO | Admitting: Family Medicine

## 2012-09-17 ENCOUNTER — Encounter: Payer: Self-pay | Admitting: Family Medicine

## 2012-09-17 ENCOUNTER — Ambulatory Visit (INDEPENDENT_AMBULATORY_CARE_PROVIDER_SITE_OTHER): Payer: BC Managed Care – PPO | Admitting: Family Medicine

## 2012-09-17 VITALS — BP 122/88 | HR 75 | Temp 98.5°F | Wt 236.0 lb

## 2012-09-17 DIAGNOSIS — J329 Chronic sinusitis, unspecified: Secondary | ICD-10-CM

## 2012-09-17 MED ORDER — AZITHROMYCIN 250 MG PO TABS: ORAL_TABLET | ORAL | Status: AC

## 2012-09-17 MED ORDER — HYDROCODONE-HOMATROPINE 5-1.5 MG/5ML PO SYRP: 5.0000 mL | ORAL_SOLUTION | ORAL | Status: DC | PRN

## 2012-09-17 MED ORDER — HYDROCODONE-HOMATROPINE 5-1.5 MG/5ML PO SYRP: 5.0000 mL | ORAL_SOLUTION | ORAL | Status: AC | PRN

## 2012-09-17 MED ORDER — AZITHROMYCIN 250 MG PO TABS: ORAL_TABLET | ORAL | Status: DC

## 2012-09-17 NOTE — Progress Notes (Signed)
  Subjective:    Patient ID: Pamela Lopez, female    DOB: [birth year], 37 y.o.   MRN: [id number]  HPI Here for 3 days of sinus pressure, PND, ST, and a dry cough. No fever.    Review of Systems  Constitutional: Negative.   HENT: Positive for congestion, postnasal drip and sinus pressure.   Eyes: Negative.   Respiratory: Positive for cough.        Objective:   Physical Exam  Constitutional: She appears well-developed and well-nourished.  HENT:  Right Ear: External ear normal.  Left Ear: External ear normal.  Nose: Nose normal.  Mouth/Throat: Oropharynx is clear and moist.  Eyes: Conjunctivae normal are normal.  Pulmonary/Chest: Effort normal and breath sounds normal.  Lymphadenopathy:    She has no cervical adenopathy.          Assessment & Plan:  Add Mucinex

## 2012-10-07 ENCOUNTER — Encounter (HOSPITAL_BASED_OUTPATIENT_CLINIC_OR_DEPARTMENT_OTHER): Payer: Self-pay | Admitting: *Deleted

## 2012-10-14 NOTE — H&P (Signed)
[facility] [address]   [address] [place], [place] [address] [phone] A Division of [facility]  Loreta Ave, M.D.   Robert A. Thurston Hole, M.D.   Burnell Blanks, M.D.   Eulas Post, M.D.   Lunette Stands, M.D Buford Dresser, M.D.  Charlsie Quest, M.D.   Estell Harpin, M.D.   Melina Fiddler, M.D. Genene Churn. Barry Dienes, PA-C            Kirstin A. Shepperson, PA-C Josh [place], PA-C [place], [place]   RE: [place], [place]                                [phone]      DOB: 12/10/1975 PROGRESS NOTE: 09-01-12 Sawyer returns in follow up.  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  Treated conservatively without improvement.  Evaluated including MRI scan showing marked changes AC joint, bone edema, secondary impingement on her cuff as a result of swelling and changes.  She has failed to improve.  We discussed at that time operative intervention with decompression and distal clavicle excision.  She is still considering her options.  She comes in today to discuss scheduling that in regards to definitive treatment.  Since I saw her last she has had some decreased motion in her shoulder.  Some referred pain up to her neck.  She also had an explosive onset of symptoms in her right shoulder a couple of weeks ago.  Sudden onset.  No trauma.  Bad enough she could barely lift her arm.  Rest and anti-inflammatories have afforded significant improvement and she only has some mild residual symptoms there.  In talking with her this sounds like a shoulder issue and nothing radicular from her neck.  Some soreness in her neck, but most of this is up the trapezius, left shoulder greater than right.  We have looked at her neck in the past, including x-rays, which failed to show significant degenerative changes in the past.   Her history, workup and treatment to date is reviewed in  detail with her.    EXAMINATION: General exam is outlined and included in the chart.  Specifically, on the left she has marked AC soreness.  I can still get through fairly good motion.  Positive impingement.  Positive palms down abduction.  Biceps and rotator cuff intact.  No apprehension or instability.  On the right some soreness AC joint.  Mildly positive impingement.  No loss of motion.  Good strength.  Cervical motion is a little limited by soreness, but fairly good if she goes slow.  Neurovascularly intact both upper extremities.    X-RAYS: Three view x-ray of her right shoulder shows degenerative changes.  Some fragmentation, osteolysis AC joint.  Not nearly as bad as the left.  Type I-II acromion.  Reasonable subacromial space.  No calcification.  Glenohumeral joint looks good.    DISPOSITION:  I have had a long extensive talk with Lael, more than 25 minutes face-to-face.  We are going to address her left shoulder with exam under anesthesia, arthroscopy, distal clavicle excision and subacromial decompression.  Procedures, risks, benefits and complications reviewed in detail.  She works at [employer] and has to be fully recovered before she can return to work, which can be up to 12 weeks.  All questions answered.  All paperwork complete.   In regards to  the neck and right shoulder, I don't think further workup is warranted.  I think the neck is secondary and I have discussed this with her.  The right shoulder has some early changes AC joint and if symptoms there recur we may have to address this in the future.  For now it is settled down enough I wouldn't do anything different.  She understands and agrees.  I will see her at the time of operative intervention on her left shoulder.    Loreta Ave, M.D.   Electronically verified by Loreta Ave, M.D. DFM:jjh D 09-01-12 T 09-02-12

## 2012-10-15 ENCOUNTER — Ambulatory Visit (HOSPITAL_BASED_OUTPATIENT_CLINIC_OR_DEPARTMENT_OTHER)
Admission: RE | Admit: 2012-10-15 | Discharge: 2012-10-15 | Disposition: A | Discharge status: Home / Self Care | Payer: BC Managed Care – PPO | Source: Ambulatory Visit | Attending: Orthopedic Surgery | Admitting: Orthopedic Surgery

## 2012-10-15 ENCOUNTER — Encounter (HOSPITAL_BASED_OUTPATIENT_CLINIC_OR_DEPARTMENT_OTHER)
Admission: RE | Disposition: A | Discharge status: Home / Self Care | Payer: Self-pay | Source: Ambulatory Visit | Attending: Orthopedic Surgery

## 2012-10-15 ENCOUNTER — Ambulatory Visit (HOSPITAL_BASED_OUTPATIENT_CLINIC_OR_DEPARTMENT_OTHER): Payer: BC Managed Care – PPO | Admitting: Anesthesiology

## 2012-10-15 ENCOUNTER — Encounter (HOSPITAL_BASED_OUTPATIENT_CLINIC_OR_DEPARTMENT_OTHER): Payer: Self-pay

## 2012-10-15 ENCOUNTER — Encounter (HOSPITAL_BASED_OUTPATIENT_CLINIC_OR_DEPARTMENT_OTHER): Payer: Self-pay | Admitting: Anesthesiology

## 2012-10-15 DIAGNOSIS — I10 Essential (primary) hypertension: Secondary | ICD-10-CM | POA: Insufficient documentation

## 2012-10-15 DIAGNOSIS — M949 Disorder of cartilage, unspecified: Secondary | ICD-10-CM | POA: Insufficient documentation

## 2012-10-15 DIAGNOSIS — M24119 Other articular cartilage disorders, unspecified shoulder: Secondary | ICD-10-CM | POA: Insufficient documentation

## 2012-10-15 DIAGNOSIS — Z9889 Other specified postprocedural states: Secondary | ICD-10-CM

## 2012-10-15 DIAGNOSIS — M899 Disorder of bone, unspecified: Secondary | ICD-10-CM | POA: Insufficient documentation

## 2012-10-15 DIAGNOSIS — M25819 Other specified joint disorders, unspecified shoulder: Secondary | ICD-10-CM | POA: Insufficient documentation

## 2012-10-15 HISTORY — PX: SHOULDER ARTHROSCOPY WITH DISTAL CLAVICLE RESECTION: SHX5675

## 2012-10-15 LAB — POCT HEMOGLOBIN-HEMACUE: Hemoglobin: 13.4 g/dL (ref 12.0–15.0)

## 2012-10-15 SURGERY — SHOULDER ARTHROSCOPY WITH DISTAL CLAVICLE RESECTION
Anesthesia: General | Site: Shoulder | Laterality: Left | Wound class: Clean

## 2012-10-15 MED ORDER — SODIUM CHLORIDE 0.9 % IR SOLN
Status: DC | PRN
  Administered 2012-10-15: 9000 mL

## 2012-10-15 MED ORDER — FENTANYL CITRATE 0.05 MG/ML IJ SOLN
INTRAMUSCULAR | Status: DC | PRN
  Administered 2012-10-15 (×3): 50 ug via INTRAVENOUS

## 2012-10-15 MED ORDER — OXYCODONE HCL 5 MG PO TABS
5.0000 mg | ORAL_TABLET | Freq: Once | ORAL | Status: AC | PRN
  Administered 2012-10-15: 5 mg via ORAL

## 2012-10-15 MED ORDER — LACTATED RINGERS IV SOLN
INTRAVENOUS | Status: DC
  Administered 2012-10-15 (×2): via INTRAVENOUS

## 2012-10-15 MED ORDER — OXYCODONE HCL 5 MG/5ML PO SOLN: 5.0000 mg | Freq: Once | ORAL | Status: AC | PRN

## 2012-10-15 MED ORDER — OXYCODONE-ACETAMINOPHEN 5-325 MG PO TABS: 1.0000 | ORAL_TABLET | ORAL | Status: DC | PRN

## 2012-10-15 MED ORDER — HYDROMORPHONE HCL PF 1 MG/ML IJ SOLN
0.2500 mg | INTRAMUSCULAR | Status: DC | PRN
  Administered 2012-10-15 (×3): 0.5 mg via INTRAVENOUS

## 2012-10-15 MED ORDER — PROPOFOL 10 MG/ML IV BOLUS
INTRAVENOUS | Status: DC | PRN
  Administered 2012-10-15: 200 mg via INTRAVENOUS

## 2012-10-15 MED ORDER — FENTANYL CITRATE 0.05 MG/ML IJ SOLN
50.0000 ug | INTRAMUSCULAR | Status: DC | PRN
  Administered 2012-10-15: 100 ug via INTRAVENOUS

## 2012-10-15 MED ORDER — VANCOMYCIN HCL IN DEXTROSE 1-5 GM/200ML-% IV SOLN
1000.0000 mg | INTRAVENOUS | Status: AC
  Administered 2012-10-15: 1000 mg via INTRAVENOUS

## 2012-10-15 MED ORDER — MIDAZOLAM HCL 2 MG/2ML IJ SOLN
1.0000 mg | INTRAMUSCULAR | Status: DC | PRN
  Administered 2012-10-15: 2 mg via INTRAVENOUS

## 2012-10-15 MED ORDER — SUCCINYLCHOLINE CHLORIDE 20 MG/ML IJ SOLN
INTRAMUSCULAR | Status: DC | PRN
  Administered 2012-10-15: 100 mg via INTRAVENOUS

## 2012-10-15 MED ORDER — ONDANSETRON HCL 4 MG/2ML IJ SOLN
INTRAMUSCULAR | Status: DC | PRN
  Administered 2012-10-15: 4 mg via INTRAVENOUS

## 2012-10-15 MED ORDER — MIDAZOLAM HCL 2 MG/ML PO SYRP: 12.0000 mg | ORAL_SOLUTION | Freq: Once | ORAL | Status: DC | PRN

## 2012-10-15 MED ORDER — DEXAMETHASONE SODIUM PHOSPHATE 4 MG/ML IJ SOLN
INTRAMUSCULAR | Status: DC | PRN
  Administered 2012-10-15: 10 mg via INTRAVENOUS

## 2012-10-15 MED ORDER — LIDOCAINE HCL (CARDIAC) 20 MG/ML IV SOLN
INTRAVENOUS | Status: DC | PRN
  Administered 2012-10-15: 50 mg via INTRAVENOUS

## 2012-10-15 MED ORDER — BUPIVACAINE-EPINEPHRINE PF 0.5-1:200000 % IJ SOLN
INTRAMUSCULAR | Status: DC | PRN
  Administered 2012-10-15: 21 mL

## 2012-10-15 MED ORDER — ONDANSETRON HCL 4 MG/2ML IJ SOLN: 4.0000 mg | Freq: Once | INTRAMUSCULAR | Status: DC | PRN

## 2012-10-15 MED ORDER — DEXAMETHASONE SODIUM PHOSPHATE 10 MG/ML IJ SOLN
INTRAMUSCULAR | Status: DC | PRN
  Administered 2012-10-15: 8 mg

## 2012-10-15 SURGICAL SUPPLY — 77 items
APL SKNCLS STERI-STRIP NONHPOA (GAUZE/BANDAGES/DRESSINGS)
BENZOIN TINCTURE PRP APPL 2/3 (GAUZE/BANDAGES/DRESSINGS) IMPLANT
BLADE CUTTER GATOR 3.5 (BLADE) ×2 IMPLANT
BLADE CUTTER MENIS 5.5 (BLADE) IMPLANT
BLADE GREAT WHITE 4.2 (BLADE) ×2 IMPLANT
BLADE SURG 15 STRL LF DISP TIS (BLADE) IMPLANT
BLADE SURG 15 STRL SS (BLADE)
BUR OVAL 6.0 (BURR) ×2 IMPLANT
CANISTER OMNI JUG 16 LITER (MISCELLANEOUS) ×2 IMPLANT
CANISTER SUCTION 2500CC (MISCELLANEOUS) IMPLANT
CANNULA DRY DOC 8X75 (CANNULA) IMPLANT
CANNULA TWIST IN 8.25X7CM (CANNULA) IMPLANT
CLOTH BEACON ORANGE TIMEOUT ST (SAFETY) ×2 IMPLANT
DECANTER SPIKE VIAL GLASS SM (MISCELLANEOUS) IMPLANT
DRAPE OEC MINIVIEW 54X84 (DRAPES) IMPLANT
DRAPE STERI 35X30 U-POUCH (DRAPES) ×2 IMPLANT
DRAPE U-SHAPE 47X51 STRL (DRAPES) ×2 IMPLANT
DRAPE U-SHAPE 76X120 STRL (DRAPES) ×4 IMPLANT
DRSG PAD ABDOMINAL 8X10 ST (GAUZE/BANDAGES/DRESSINGS) ×2 IMPLANT
DURAPREP 26ML APPLICATOR (WOUND CARE) ×2 IMPLANT
ELECT MENISCUS 165MM 90D (ELECTRODE) ×2 IMPLANT
ELECT NDL TIP 2.8 STRL (NEEDLE) IMPLANT
ELECT NEEDLE TIP 2.8 STRL (NEEDLE) IMPLANT
ELECT REM PT RETURN 9FT ADLT (ELECTROSURGICAL) ×2
ELECTRODE REM PT RTRN 9FT ADLT (ELECTROSURGICAL) ×1 IMPLANT
GAUZE XEROFORM 1X8 LF (GAUZE/BANDAGES/DRESSINGS) ×2 IMPLANT
GLOVE BIOGEL PI IND STRL 7.0 (GLOVE) IMPLANT
GLOVE BIOGEL PI IND STRL 8 (GLOVE) ×1 IMPLANT
GLOVE BIOGEL PI INDICATOR 7.0 (GLOVE) ×2
GLOVE BIOGEL PI INDICATOR 8 (GLOVE) ×1
GLOVE ORTHO TXT STRL SZ7.5 (GLOVE) ×2 IMPLANT
GLOVE SKINSENSE NS SZ6.5 (GLOVE) ×1
GLOVE SKINSENSE NS SZ7.0 (GLOVE) ×1
GLOVE SKINSENSE NS SZ7.5 (GLOVE) ×1
GLOVE SKINSENSE STRL SZ6.5 (GLOVE) IMPLANT
GLOVE SKINSENSE STRL SZ7.0 (GLOVE) IMPLANT
GLOVE SKINSENSE STRL SZ7.5 (GLOVE) ×1 IMPLANT
GOWN PREVENTION PLUS XLARGE (GOWN DISPOSABLE) ×4 IMPLANT
GOWN STRL REIN 2XL XLG LVL4 (GOWN DISPOSABLE) ×2 IMPLANT
IV NS IRRIG 3000ML ARTHROMATIC (IV SOLUTION) ×6 IMPLANT
NDL SCORPION MULTI FIRE (NEEDLE) IMPLANT
NDL SUT 6 .5 CRC .975X.05 MAYO (NEEDLE) IMPLANT
NEEDLE MAYO TAPER (NEEDLE)
NEEDLE SCORPION MULTI FIRE (NEEDLE) IMPLANT
NS IRRIG 1000ML POUR BTL (IV SOLUTION) IMPLANT
PACK ARTHROSCOPY DSU (CUSTOM PROCEDURE TRAY) ×2 IMPLANT
PACK BASIN DAY SURGERY FS (CUSTOM PROCEDURE TRAY) ×2 IMPLANT
PASSER SUT SWANSON 36MM LOOP (INSTRUMENTS) IMPLANT
PENCIL BUTTON HOLSTER BLD 10FT (ELECTRODE) ×2 IMPLANT
SET ARTHROSCOPY TUBING (MISCELLANEOUS) ×2
SET ARTHROSCOPY TUBING LN (MISCELLANEOUS) ×1 IMPLANT
SLEEVE SCD COMPRESS KNEE MED (MISCELLANEOUS) ×1 IMPLANT
SLING ARM FOAM STRAP LRG (SOFTGOODS) ×1 IMPLANT
SLING ARM FOAM STRAP MED (SOFTGOODS) IMPLANT
SLING ARM FOAM STRAP XLG (SOFTGOODS) IMPLANT
SLING ARM IMMOBILIZER LRG (SOFTGOODS) IMPLANT
SLING ARM IMMOBILIZER MED (SOFTGOODS) IMPLANT
SPONGE GAUZE 4X4 12PLY (GAUZE/BANDAGES/DRESSINGS) ×4 IMPLANT
SPONGE LAP 4X18 X RAY DECT (DISPOSABLE) IMPLANT
STRIP CLOSURE SKIN 1/2X4 (GAUZE/BANDAGES/DRESSINGS) IMPLANT
SUCTION FRAZIER TIP 10 FR DISP (SUCTIONS) IMPLANT
SUT ETHIBOND 2 OS 4 DA (SUTURE) IMPLANT
SUT ETHILON 2 0 FS 18 (SUTURE) IMPLANT
SUT ETHILON 3 0 PS 1 (SUTURE) ×1 IMPLANT
SUT FIBERWIRE #2 38 T-5 BLUE (SUTURE)
SUT RETRIEVER MED (INSTRUMENTS) IMPLANT
SUT TIGER TAPE 7 IN WHITE (SUTURE) IMPLANT
SUT VIC AB 0 CT1 27 (SUTURE)
SUT VIC AB 0 CT1 27XBRD ANBCTR (SUTURE) IMPLANT
SUT VIC AB 2-0 SH 27 (SUTURE)
SUT VIC AB 2-0 SH 27XBRD (SUTURE) IMPLANT
SUT VIC AB 3-0 FS2 27 (SUTURE) IMPLANT
SUTURE FIBERWR #2 38 T-5 BLUE (SUTURE) IMPLANT
TAPE FIBER 2MM 7IN #2 BLUE (SUTURE) IMPLANT
TOWEL OR 17X24 6PK STRL BLUE (TOWEL DISPOSABLE) ×2 IMPLANT
WATER STERILE IRR 1000ML POUR (IV SOLUTION) ×2 IMPLANT
YANKAUER SUCT BULB TIP NO VENT (SUCTIONS) IMPLANT

## 2012-10-15 NOTE — Interval H&P Note (Signed)
History and Physical Interval Note:  10/15/2012 7:26 AM  Pamela Lopez  has presented today for surgery, with the diagnosis of left shoulder degenerative arthritis shoulder, disorders of bursea and tendon in shoulder region   The various methods of treatment have been discussed with the patient and family. After consideration of risks, benefits and other options for treatment, the patient has consented to  Procedure(s) with comments: SHOULDER ARTHROSCOPY WITH DISTAL CLAVICLE RESECTION (Left) - LEFT SHOULDER DISTAL CLAVICULECTOMY DECOMPRESSION SUBACROMIAL PARTIAL ACROMIOPLASTY WITH CORACOACROMIAL RELEASE  as a surgical intervention .  The patient's history has been reviewed, patient examined, no change in status, stable for surgery.  I have reviewed the patient's chart and labs.  Questions were answered to the patient's satisfaction.     Aleksia Freiman F

## 2012-10-15 NOTE — Anesthesia Procedure Notes (Addendum)
Anesthesia Regional Block:  Interscalene brachial plexus block  Pre-Anesthetic Checklist: ,, timeout performed, Correct Patient, Correct Site, Correct Laterality, Correct Procedure, Correct Position, site marked, Risks and benefits discussed,  Surgical consent,  Pre-op evaluation,  At surgeon's request and post-op pain management  Laterality: Left and Upper  Prep: chloraprep       Needles:  Injection technique: Single-shot  Needle Type: Echogenic Needle     Needle Length: 5cm 5 cm Needle Gauge: 21    Additional Needles:  Procedures: ultrasound guided (picture in chart) Interscalene brachial plexus block Narrative:  Start time: 10/15/2012 7:05 AM End time: 10/15/2012 7:13 AM Injection made incrementally with aspirations every 5 mL.  Performed by: Personally  Anesthesiologist: Sheldon Silvan  Additional Notes: The [place] machine did not capture the picture of the block.  Supraclavicular block Procedure Name: Intubation Date/Time: 10/15/2012 7:35 AM Performed by: Caren Macadam Pre-anesthesia Checklist: Patient identified, Emergency Drugs available, Suction available and Patient being monitored Patient Re-evaluated:Patient Re-evaluated prior to inductionOxygen Delivery Method: Circle System Utilized Preoxygenation: Pre-oxygenation with 100% oxygen Intubation Type: IV induction Ventilation: Mask ventilation without difficulty Laryngoscope Size: Miller and 2 Grade View: Grade I Tube type: Oral Tube size: 7.0 mm Number of attempts: 1 Airway Equipment and Method: stylet Placement Confirmation: ETT inserted through vocal cords under direct vision,  positive ETCO2 and breath sounds checked- equal and bilateral Secured at: 23 cm Tube secured with: Tape Dental Injury: Teeth and Oropharynx as per pre-operative assessment

## 2012-10-15 NOTE — Progress Notes (Signed)
Assisted Dr. Crews with left, ultrasound guided, interscalene  block. Side rails up, monitors on throughout procedure. See vital signs in flow sheet. Tolerated Procedure well. 

## 2012-10-15 NOTE — Anesthesia Postprocedure Evaluation (Signed)
  Anesthesia Post-op Note  Patient: Pamela Lopez  Procedure(s) Performed: Procedure(s) with comments: SHOULDER ARTHROSCOPY WITH DISTAL CLAVICLE RESECTION, LABRAL DEBRIDEMENT, ACROMIOPLASTY (Left) - LEFT SHOULDER DISTAL CLAVICULECTOMY DECOMPRESSION SUBACROMIAL PARTIAL ACROMIOPLASTY WITH CORACOACROMIAL RELEASE   Patient Location: PACU  Anesthesia Type:GA combined with regional for post-op pain  Level of Consciousness: awake, alert  and oriented  Airway and Oxygen Therapy: Patient Spontanous Breathing and Patient connected to face mask oxygen  Post-op Pain: mild  Post-op Assessment: Post-op Vital signs reviewed  Post-op Vital Signs: Reviewed  Complications: No apparent anesthesia complications

## 2012-10-15 NOTE — Brief Op Note (Signed)
10/15/2012  9:43 AM  PATIENT:  Pamela Lopez  37 y.o. female  PRE-OPERATIVE DIAGNOSIS:  left shoulder degenerative arthritis shoulder, disorders of bursea and tendon in shoulder region   POST-OPERATIVE DIAGNOSIS:  left shoulder degenerative arthritis shoulder, disorders of bursea and tendon in shoulder region   PROCEDURE:  Procedure(s) with comments: SHOULDER ARTHROSCOPY WITH DISTAL CLAVICLE RESECTION, LABRAL DEBRIDEMENT, ACROMIOPLASTY (Left) - LEFT SHOULDER DISTAL CLAVICULECTOMY DECOMPRESSION SUBACROMIAL PARTIAL ACROMIOPLASTY WITH CORACOACROMIAL RELEASE   SURGEON:  Surgeon(s) and Role:    * Loreta Ave, MD - Primary  PHYSICIAN ASSISTANT: Zonia Kief M    ANESTHESIA:   regional and general  EBL:  Total I/O In: 1000 [I.V.:1000] Out: -    SPECIMEN:  No Specimen  DISPOSITION OF SPECIMEN:  N/A  COUNTS:  YES  TOURNIQUET:  * No tourniquets in log *  PATIENT DISPOSITION:  PACU - hemodynamically stable.

## 2012-10-15 NOTE — Transfer of Care (Signed)
Immediate Anesthesia Transfer of Care Note  Patient: Pamela Lopez  Procedure(s) Performed: Procedure(s) with comments: SHOULDER ARTHROSCOPY WITH DISTAL CLAVICLE RESECTION, LABRAL DEBRIDEMENT,  (Left) - LEFT SHOULDER DISTAL CLAVICULECTOMY DECOMPRESSION SUBACROMIAL PARTIAL ACROMIOPLASTY WITH CORACOACROMIAL RELEASE   Patient Location: PACU  Anesthesia Type:GA combined with regional for post-op pain  Level of Consciousness: awake and alert   Airway & Oxygen Therapy: Patient Spontanous Breathing and Patient connected to face mask oxygen  Post-op Assessment: Report given to PACU RN and Post -op Vital signs reviewed and stable  Post vital signs: Reviewed and stable  Complications: No apparent anesthesia complications

## 2012-10-15 NOTE — Anesthesia Preprocedure Evaluation (Addendum)
Anesthesia Evaluation  Patient identified by MRN, date of birth, ID band Patient awake    Reviewed: Allergy & Precautions, H&P , NPO status , Patient's Chart, lab work & pertinent test results  Airway Mallampati: II  Neck ROM: Full    Dental  (+) Teeth Intact   Pulmonary asthma ,  breath sounds clear to auscultation        Cardiovascular hypertension, Rhythm:Regular Rate:Normal     Neuro/Psych  Headaches, PSYCHIATRIC DISORDERS  Neuromuscular disease    GI/Hepatic GERD-  Controlled,  Endo/Other    Renal/GU      Musculoskeletal   Abdominal   Peds  Hematology   Anesthesia Other Findings   Reproductive/Obstetrics                          Anesthesia Physical Anesthesia Plan  ASA: III  Anesthesia Plan: General   Post-op Pain Management:    Induction: Intravenous  Airway Management Planned: Oral ETT  Additional Equipment:   Intra-op Plan:   Post-operative Plan: Extubation in OR  Informed Consent: I have reviewed the patients History and Physical, chart, labs and discussed the procedure including the risks, benefits and alternatives for the proposed anesthesia with the patient or authorized representative who has indicated his/her understanding and acceptance.   Dental advisory given  Plan Discussed with: CRNA and Anesthesiologist  Anesthesia Plan Comments:         Anesthesia Quick Evaluation

## 2012-10-19 ENCOUNTER — Encounter (HOSPITAL_BASED_OUTPATIENT_CLINIC_OR_DEPARTMENT_OTHER): Payer: Self-pay | Admitting: Orthopedic Surgery

## 2012-10-19 NOTE — Op Note (Signed)
NAMEMarland Kitchen  Pamela Lopez, Pamela Lopez NO.:  [id number]  MEDICAL RECORD NO.:  [id number]  LOCATION:                               FACILITY:  [facility]  PHYSICIAN:  Loreta Ave, M.D. DATE OF BIRTH:  [birth year]  DATE OF PROCEDURE:  10/15/2012 DATE OF DISCHARGE:  10/15/2012                              OPERATIVE REPORT   PREOPERATIVE DIAGNOSES: 1. Left shoulder subacromial impingement. 2. Distal clavicle osteolysis.  POSTOPERATIVE DIAGNOSIS: 1. Left shoulder subacromial impingement. 2. Distal clavicle osteolysis. 3. Small anterior labrum tear and Buford complex.  PROCEDURES: 1. Left shoulder exam under anesthesia, arthroscopy. 2. Debridement of labrum. 3. Bursectomy. 4. Acromioplasty. 5. Coracoacromial ligament release. 6. Excision of distal clavicle.  SURGEON:  Loreta Ave, MD.  ASSISTANT:  Genene Churn. Barry Dienes, [place], present throughout the entire case and necessary for timely completion of procedure.  ANESTHESIA:  General.  ESTIMATED BLOOD LOSS:  Minimal.  SPECIMENS:  None.  CULTURES:  None.  COMPLICATION:  None.  DRESSING:  Soft compressive with shoulder immobilizer.  DESCRIPTION OF PROCEDURE:  The patient was brought to the operating room, placed on the operating table in supine position.  After adequate anesthesia had been obtained, shoulder was examined.  Full motion of stable shoulder.  Placed in beach-chair position on the shoulder positioner, prepped and draped in usual sterile fashion.  Three portals, anterior, posterior, and lateral.  Arthroscope was introduced, shoulder distended and inspected.  Articular cartilage looked good.  Undersurface of the cuff looked good.  Biceps tendon, biceps anchor intact.  Anterior variant Buford complex with the anterior labrum blending into the capsule ligamentous structures superiorly.  The inferior labrum and capsule ligamentous structures intact.  A small torn band between the two was debrided.  No SLAP tear.   Cannula redirected subacromially. Type 2 acromion.  Marked reactive bursitis.  Bursa resected.  Top of the cuff looked good.  Acromioplasty to a type 1 acromion, releasing CA ligament.  Distal clavicle marked edema, grade 3 and 4 changes. Periarticular spurs, lateral cm of clavicle resected.  At completion, adequacy of decompression, clavicle excision confirmed.  Instruments were removed.  Portals were closed with nylon.  Sterile compressive dressing applied.  Sling applied.  Anesthesia reversed.  Brought to the recovery room.  Tolerated the surgery well.  No complications.     Loreta Ave, M.D.     DFM/MEDQ  D:  10/15/2012  T:  10/15/2012  Job:  [id number]

## 2012-11-12 ENCOUNTER — Other Ambulatory Visit: Payer: Self-pay | Admitting: Obstetrics and Gynecology

## 2013-03-03 ENCOUNTER — Ambulatory Visit: Payer: BC Managed Care – PPO | Admitting: Family Medicine

## 2013-03-03 ENCOUNTER — Encounter: Payer: Self-pay | Admitting: Internal Medicine

## 2013-03-03 ENCOUNTER — Ambulatory Visit (INDEPENDENT_AMBULATORY_CARE_PROVIDER_SITE_OTHER): Payer: BC Managed Care – PPO | Admitting: Internal Medicine

## 2013-03-03 VITALS — BP 116/76 | HR 70 | Temp 98.0°F | Wt 223.0 lb

## 2013-03-03 DIAGNOSIS — L309 Dermatitis, unspecified: Secondary | ICD-10-CM

## 2013-03-03 DIAGNOSIS — L738 Other specified follicular disorders: Secondary | ICD-10-CM

## 2013-03-03 DIAGNOSIS — L739 Follicular disorder, unspecified: Secondary | ICD-10-CM

## 2013-03-03 DIAGNOSIS — L259 Unspecified contact dermatitis, unspecified cause: Secondary | ICD-10-CM

## 2013-03-03 MED ORDER — CEPHALEXIN 500 MG PO CAPS: 500.0000 mg | ORAL_CAPSULE | Freq: Two times a day (BID) | ORAL | Status: DC
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
## 2013-03-03 NOTE — Patient Instructions (Addendum)
The rash on the legs acts like a folliculitis possibly triggered by shaving..  Take the antibiotic no shaving until better you shaving gel we need to restart keeps a razor out of the shower.  Can take generic Zyrtec once a day and add Benadryl on at night cool compresses as needed.  Expect improvement in the next 3-5 days. Contact our practice service if persistent progressive. It may take a week or so to be totally gone.   Folliculitis  Folliculitis is redness, soreness, and swelling (inflammation) of the hair follicles. This condition can occur anywhere on the body. People with weakened immune systems, diabetes, or obesity have a greater risk of getting folliculitis. CAUSES  Bacterial infection. This is the most common cause.  Fungal infection.  Viral infection.  Contact with certain chemicals, especially oils and tars. Long-term folliculitis can result from bacteria that live in the nostrils. The bacteria may trigger multiple outbreaks of folliculitis over time. SYMPTOMS Folliculitis most commonly occurs on the scalp, thighs, legs, back, buttocks, and areas where hair is shaved frequently. An early sign of folliculitis is a small, white or yellow, pus-filled, itchy lesion (pustule). These lesions appear on a red, inflamed follicle. They are usually less than 0.2 inches (5 mm) wide. When there is an infection of the follicle that goes deeper, it becomes a boil or furuncle. A group of closely packed boils creates a larger lesion (carbuncle). Carbuncles tend to occur in hairy, sweaty areas of the body. DIAGNOSIS  Your caregiver can usually tell what is wrong by doing a physical exam. A sample may be taken from one of the lesions and tested in a lab. This can help determine what is causing your folliculitis. TREATMENT  Treatment may include:  Applying warm compresses to the affected areas.  Taking antibiotic medicines orally or applying them to the skin.  Draining the lesions if they  contain a large amount of pus or fluid.  Laser hair removal for cases of long-lasting folliculitis. This helps to prevent regrowth of the hair. HOME CARE INSTRUCTIONS  Apply warm compresses to the affected areas as directed by your caregiver.  If antibiotics are prescribed, take them as directed. Finish them even if you start to feel better.  You may take over-the-counter medicines to relieve itching.  Do not shave irritated skin.  Follow up with your caregiver as directed. SEEK IMMEDIATE MEDICAL CARE IF:   You have increasing redness, swelling, or pain in the affected area.  You have a fever. MAKE SURE YOU:  Understand these instructions.  Will watch your condition.  Will get help right away if you are not doing well or get worse. Document Released: 10/14/2001 Document Revised: 02/04/2012 Document Reviewed: 11/05/2011 [facility] Patient Information 2014 [place], [place].

## 2013-03-04 ENCOUNTER — Encounter (HOSPITAL_COMMUNITY): Payer: Self-pay | Admitting: Emergency Medicine

## 2013-03-04 ENCOUNTER — Emergency Department (HOSPITAL_COMMUNITY)
Admission: EM | Admit: 2013-03-04 | Discharge: 2013-03-04 | Disposition: A | Discharge status: Home / Self Care | Payer: BC Managed Care – PPO | Source: Home / Self Care

## 2013-03-04 DIAGNOSIS — L309 Dermatitis, unspecified: Secondary | ICD-10-CM

## 2013-03-04 DIAGNOSIS — L259 Unspecified contact dermatitis, unspecified cause: Secondary | ICD-10-CM

## 2013-03-04 MED ORDER — HYDROXYZINE HCL 25 MG PO TABS: 25.0000 mg | ORAL_TABLET | Freq: Three times a day (TID) | ORAL | Status: DC | PRN

## 2013-03-04 MED ORDER — PREDNISONE 5 MG PO KIT: PACK | ORAL | Status: DC

## 2013-03-04 NOTE — ED Notes (Signed)
Pt c/o rash onset Saturday.... Saw PCP yest and dx w/folluculitis; given cephalexin 500mg ; no relief... Rash started at legs and working its way up; rash on legs, thighs, arms, neck, face.... Denies fevers, SOB... She is alert w/no signs of acute distress.

## 2013-03-04 NOTE — ED Provider Notes (Signed)
Pamela Lopez is a 37 y.o. female who presents to Urgent Care today for worsening rash. Patient developed pruritic papules on her legs starting Saturday. She was seen by her primary care provider yesterday who thought perhaps impetigo and treated with Keflex. However the rash continues to worsen become quite itchy. She notes small pruritic papules from her extremities up to her neck. She denies any flulike illness fevers or chills and feels well otherwise. She has tried Keflex, and over-the-counter allergy medications. No mucocutaneous involvement. No new medications.   PMH reviewed. Healthy otherwise History  Substance Use Topics  . Smoking status: Never Smoker   . Smokeless tobacco: Never Used  . Alcohol Use: No   ROS as above Medications reviewed. No current facility-administered medications for this encounter.   Current Outpatient Prescriptions  Medication Sig Dispense Refill  . albuterol (PROVENTIL HFA;VENTOLIN HFA) 108 (90 BASE) MCG/ACT inhaler Inhale 2 puffs into the lungs every 4 (four) hours as needed for wheezing or shortness of breath.  3 Inhaler  3  . cephALEXin (KEFLEX) 500 MG capsule Take 1 capsule (500 mg total) by mouth 2 (two) times daily. For follicultits  20 capsule  0  . fish oil-omega-3 fatty acids 1000 MG capsule Take 2 g by mouth daily.      . hydrOXYzine (ATARAX/VISTARIL) 25 MG tablet Take 1 tablet (25 mg total) by mouth every 8 (eight) hours as needed for itching.  30 tablet  0  . ketoconazole (NIZORAL) 2 % shampoo Once every 2 weeks.      . Multiple Vitamin (MULTIVITAMIN) tablet Take 1 tablet by mouth daily.      Suzzanne Cloud Estradiol (SPRINTEC 28 PO) Take by mouth.      . PredniSONE 5 MG KIT 12 dose pack  1 kit  0  . promethazine (PHENERGAN) 25 MG tablet Take 1 tablet (25 mg total) by mouth every 6 (six) hours as needed for nausea.  180 tablet  3  . topiramate (TOPAMAX) 100 MG tablet Take 100 mg by mouth at bedtime as needed.          Exam:  LMP  02/16/2013 Gen: Well NAD HEENT: EOMI,  MMM Lungs: CTABL Nl WOB Heart: RRR no MRG Abd: NABS, NT, ND Exts: Non edematous BL  LE, warm and well perfused.  Skin: Small scattered mildly red papules. No vesicles or pustules. Some excoriated. No petechia.   No results found for this or any previous visit (from the past 24 hour(s)). No results found.  Assessment and Plan: 37 y.o. female with dermatitis. 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  Seems more allergic type of an infectious.   plan to treat with oral prednisone 12 day taper Hydroxyzine as needed for itching Followup as needed Discussed warning signs or symptoms. Please see discharge instructions. Patient expresses understanding.      Pamela Bong, MD 03/04/13 [phone]

## 2013-05-21 ENCOUNTER — Ambulatory Visit (INDEPENDENT_AMBULATORY_CARE_PROVIDER_SITE_OTHER): Payer: BC Managed Care – PPO

## 2013-05-21 DIAGNOSIS — Z23 Encounter for immunization: Secondary | ICD-10-CM

## 2013-05-25 ENCOUNTER — Other Ambulatory Visit: Payer: Self-pay | Admitting: Family Medicine

## 2013-06-14 ENCOUNTER — Encounter (HOSPITAL_COMMUNITY): Payer: Self-pay | Admitting: Emergency Medicine

## 2013-06-14 ENCOUNTER — Emergency Department (HOSPITAL_COMMUNITY)
Admission: EM | Admit: 2013-06-14 | Discharge: 2013-06-14 | Disposition: A | Discharge status: Home / Self Care | Payer: BC Managed Care – PPO | Source: Home / Self Care | Attending: Emergency Medicine | Admitting: Emergency Medicine

## 2013-06-14 DIAGNOSIS — J069 Acute upper respiratory infection, unspecified: Secondary | ICD-10-CM

## 2013-06-14 DIAGNOSIS — J329 Chronic sinusitis, unspecified: Secondary | ICD-10-CM

## 2013-06-14 DIAGNOSIS — B9789 Other viral agents as the cause of diseases classified elsewhere: Secondary | ICD-10-CM

## 2013-06-14 MED ORDER — AZITHROMYCIN 250 MG PO TABS: ORAL_TABLET | ORAL | Status: DC

## 2013-06-14 MED ORDER — MOMETASONE FUROATE 50 MCG/ACT NA SUSP: NASAL | Status: DC

## 2013-06-14 MED ORDER — PSEUDOEPHEDRINE-GUAIFENESIN ER 120-1200 MG PO TB12: 1.0000 | ORAL_TABLET | Freq: Two times a day (BID) | ORAL | Status: DC | PRN

## 2013-06-14 MED ORDER — PREDNISONE 10 MG PO KIT: PACK | ORAL | Status: DC
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
## 2013-06-14 NOTE — ED Notes (Signed)
Pt   Reports  Symptoms  Of  of  Congestion     Cough         X  4  Days        Symptoms  Not  releived  By otc  meds          Pt  Sitting  Upright on  Exam table  Speaking in  Complete  sentances   And  Is  In no  Severe  Distress

## 2013-06-14 NOTE — ED Provider Notes (Signed)
Medical screening examination/treatment/procedure(s) were performed by non-physician practitioner and as supervising physician I was immediately available for consultation/collaboration.  Leslee Home, M.D.  Reuben Likes, MD 06/14/13 [phone]

## 2013-08-27 ENCOUNTER — Ambulatory Visit: Payer: BC Managed Care – PPO | Admitting: Family Medicine

## 2013-11-26 ENCOUNTER — Ambulatory Visit (INDEPENDENT_AMBULATORY_CARE_PROVIDER_SITE_OTHER): Payer: BC Managed Care – PPO | Admitting: Family

## 2013-11-26 ENCOUNTER — Encounter: Payer: Self-pay | Admitting: Family

## 2013-11-26 VITALS — BP 120/80 | HR 81 | Temp 97.8°F | Wt 240.0 lb

## 2013-11-26 DIAGNOSIS — J301 Allergic rhinitis due to pollen: Secondary | ICD-10-CM

## 2013-11-26 DIAGNOSIS — J309 Allergic rhinitis, unspecified: Secondary | ICD-10-CM

## 2013-11-26 MED ORDER — METHYLPREDNISOLONE 4 MG PO KIT: PACK | ORAL | Status: AC

## 2013-11-26 NOTE — Progress Notes (Signed)
Pre visit review using our clinic review tool, if applicable. No additional management support is needed unless otherwise documented below in the visit note. 
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
## 2013-11-26 NOTE — Patient Instructions (Signed)
Hay Fever Hay fever is an allergic reaction to particles in the air. It cannot be passed from person to person. It cannot be cured, but it can be controlled. CAUSES  Hay fever is caused by something that triggers an allergic reaction (allergens). The following are examples of allergens:  Ragweed.  Feathers.  Animal dander.  Grass and tree pollens.  Cigarette smoke.  House dust.  Pollution. SYMPTOMS   Sneezing.  Runny or stuffy nose.  Tearing eyes.  Itchy eyes, nose, mouth, throat, skin, or other area.  Sore throat.  Headache.  Decreased sense of smell or taste. DIAGNOSIS Your caregiver will perform a physical exam and ask questions about the symptoms you are having.Allergy testing may be done to determine exactly what triggers your hay fever.  TREATMENT   Over-the-counter medicines may help symptoms. These include:  Antihistamines.  Decongestants. These may help with nasal congestion.  Your caregiver may prescribe medicines if over-the-counter medicines do not work.  Some people benefit from allergy shots when other medicines are not helpful. HOME CARE INSTRUCTIONS   Avoid the allergen that is causing your symptoms, if possible.  Take all medicine as told by your caregiver. SEEK MEDICAL CARE IF:   You have severe allergy symptoms and your current medicines are not helping.  Your treatment was working at one time, but you are now experiencing symptoms.  You have sinus congestion and pressure.  You develop a fever or headache.  You have thick nasal discharge.  You have asthma and have a worsening cough and wheezing. SEEK IMMEDIATE MEDICAL CARE IF:   You have swelling of your tongue or lips.  You have trouble breathing.  You feel lightheaded or like you are going to faint.  You have cold sweats.  You have a fever. Document Released: 08/05/2005 Document Revised: 10/28/2011 Document Reviewed: 10/31/2010 [employer] Patient Information 2014  [employer]  

## 2014-01-13 ENCOUNTER — Other Ambulatory Visit: Payer: Self-pay | Admitting: Family Medicine

## 2014-01-13 DIAGNOSIS — IMO0002 Reserved for concepts with insufficient information to code with codable children: Secondary | ICD-10-CM

## 2014-01-19 ENCOUNTER — Ambulatory Visit (HOSPITAL_COMMUNITY)
Admission: RE | Admit: 2014-01-19 | Discharge: 2014-01-19 | Disposition: A | Discharge status: Home / Self Care | Payer: BC Managed Care – PPO | Source: Ambulatory Visit | Attending: Family Medicine | Admitting: Family Medicine

## 2014-01-19 DIAGNOSIS — N979 Female infertility, unspecified: Secondary | ICD-10-CM | POA: Insufficient documentation

## 2014-01-19 DIAGNOSIS — IMO0002 Reserved for concepts with insufficient information to code with codable children: Secondary | ICD-10-CM

## 2014-01-19 MED ORDER — IOHEXOL 300 MG/ML  SOLN
20.0000 mL | Freq: Once | INTRAMUSCULAR | Status: AC | PRN
  Administered 2014-01-19: 7 mL

## 2014-02-21 ENCOUNTER — Ambulatory Visit (INDEPENDENT_AMBULATORY_CARE_PROVIDER_SITE_OTHER): Payer: BC Managed Care – PPO | Admitting: Family Medicine

## 2014-02-21 ENCOUNTER — Encounter: Payer: Self-pay | Admitting: Family Medicine

## 2014-02-21 VITALS — BP 122/86 | HR 71 | Temp 98.8°F | Ht 66.5 in | Wt 242.0 lb

## 2014-02-21 DIAGNOSIS — E559 Vitamin D deficiency, unspecified: Secondary | ICD-10-CM

## 2014-02-21 DIAGNOSIS — H8110 Benign paroxysmal vertigo, unspecified ear: Secondary | ICD-10-CM

## 2014-02-21 MED ORDER — ALBUTEROL SULFATE HFA 108 (90 BASE) MCG/ACT IN AERS: 2.0000 | INHALATION_SPRAY | RESPIRATORY_TRACT | Status: DC | PRN

## 2014-02-21 MED ORDER — MECLIZINE HCL 25 MG PO TABS: 25.0000 mg | ORAL_TABLET | ORAL | Status: DC | PRN

## 2014-02-21 MED ORDER — PROMETHAZINE HCL 25 MG PO TABS: ORAL_TABLET | ORAL | Status: DC

## 2014-02-21 NOTE — Progress Notes (Signed)
   Subjective:    Patient ID: Pamela Lopez, female    DOB: 04/30/1976, 38 y.o.   MRN: [id number]  HPI Here for 10 days of intermittent dizziness, which she has never felt before. The room seems to spin when she moves her head. No HA or nausea.    Review of Systems  Constitutional: Negative.   HENT: Negative.   Eyes: Negative.   Neurological: Positive for dizziness. Negative for light-headedness and headaches.       Objective:   Physical Exam  Constitutional: She is oriented to person, place, and time. She appears well-developed and well-nourished. No distress.  HENT:  Head: Normocephalic and atraumatic.  Right Ear: External ear normal.  Left Ear: External ear normal.  Nose: Nose normal.  Mouth/Throat: Oropharynx is clear and moist.  Eyes: Conjunctivae and EOM are normal. Pupils are equal, round, and reactive to light.  Neck: Neck supple. No thyromegaly present.  Lymphadenopathy:    She has no cervical adenopathy.  Neurological: She is alert and oriented to person, place, and time. She has normal reflexes. No cranial nerve deficit. She exhibits normal muscle tone. Coordination normal.          Assessment & Plan:  She will take an OTC Zyrtec D or Allegra D daily for a few weeks. Drink fluids. Add Meclizine prn

## 2014-02-21 NOTE — Progress Notes (Signed)
Pre visit review using our clinic review tool, if applicable. No additional management support is needed unless otherwise documented below in the visit note. 

## 2014-03-02 ENCOUNTER — Encounter: Payer: Self-pay | Admitting: Family Medicine

## 2014-03-02 ENCOUNTER — Ambulatory Visit (INDEPENDENT_AMBULATORY_CARE_PROVIDER_SITE_OTHER): Payer: BC Managed Care – PPO | Admitting: Family Medicine

## 2014-03-02 VITALS — BP 127/81 | HR 74 | Temp 98.9°F | Ht 66.5 in | Wt 242.0 lb

## 2014-03-02 DIAGNOSIS — R599 Enlarged lymph nodes, unspecified: Secondary | ICD-10-CM

## 2014-03-02 DIAGNOSIS — R590 Localized enlarged lymph nodes: Secondary | ICD-10-CM

## 2014-03-02 NOTE — Progress Notes (Signed)
   Subjective:    Patient ID: Pamela Lopez, female    DOB: [birth year], 38 y.o.   MRN: [id number]  HPI Here for 3 days of a small tender lump on the back of the neck. No URI sx, no fever, no scalp rashes. No recent piercings, etc. Her vertigo is slowly improving.    Review of Systems  Constitutional: Negative.   HENT: Negative.   Skin: Negative.        Objective:   Physical Exam  Constitutional: She appears well-developed and well-nourished.  HENT:  Head: Normocephalic and atraumatic.  Right Ear: External ear normal.  Left Ear: External ear normal.  Nose: Nose normal.  Mouth/Throat: Oropharynx is clear and moist.  Eyes: Conjunctivae are normal.  Neck: No thyromegaly present.          Assessment & Plan:  Single small tender left posterior cervical node that is inflamed. We will observe and she will return of this gets worse or if other nodes become involved.

## 2014-03-02 NOTE — Progress Notes (Signed)
Pre visit review using our clinic review tool, if applicable. No additional management support is needed unless otherwise documented below in the visit note. 

## 2014-03-08 ENCOUNTER — Ambulatory Visit: Payer: BC Managed Care – PPO | Admitting: Family Medicine

## 2014-04-20 ENCOUNTER — Other Ambulatory Visit (INDEPENDENT_AMBULATORY_CARE_PROVIDER_SITE_OTHER): Payer: BC Managed Care – PPO

## 2014-04-20 DIAGNOSIS — Z0000 Encounter for general adult medical examination without abnormal findings: Secondary | ICD-10-CM

## 2014-04-20 DIAGNOSIS — E559 Vitamin D deficiency, unspecified: Secondary | ICD-10-CM

## 2014-04-20 LAB — CBC WITH DIFFERENTIAL/PLATELET
Basophils Absolute: 0 10*3/uL (ref 0.0–0.1)
Basophils Relative: 0.5 % (ref 0.0–3.0)
EOS ABS: 0.1 10*3/uL (ref 0.0–0.7)
EOS PCT: 1.8 % (ref 0.0–5.0)
HCT: 37.5 % (ref 36.0–46.0)
HEMOGLOBIN: 12.5 g/dL (ref 12.0–15.0)
Lymphocytes Relative: 41.9 % (ref 12.0–46.0)
Lymphs Abs: 1.7 10*3/uL (ref 0.7–4.0)
MCHC: 33.3 g/dL (ref 30.0–36.0)
MCV: 87.2 fl (ref 78.0–100.0)
Monocytes Absolute: 0.3 10*3/uL (ref 0.1–1.0)
Monocytes Relative: 8.1 % (ref 3.0–12.0)
NEUTROS ABS: 1.9 10*3/uL (ref 1.4–7.7)
Neutrophils Relative %: 47.7 % (ref 43.0–77.0)
Platelets: 199 10*3/uL (ref 150.0–400.0)
RBC: 4.3 Mil/uL (ref 3.87–5.11)
RDW: 13.7 % (ref 11.5–15.5)
WBC: 4 10*3/uL (ref 4.0–10.5)

## 2014-04-20 LAB — POCT URINALYSIS DIPSTICK
Bilirubin, UA: NEGATIVE
Blood, UA: NEGATIVE
Glucose, UA: NEGATIVE
Ketones, UA: NEGATIVE
Leukocytes, UA: NEGATIVE
Nitrite, UA: NEGATIVE
PH UA: 5.5
PROTEIN UA: NEGATIVE
SPEC GRAV UA: 1.02
Urobilinogen, UA: 0.2

## 2014-04-20 LAB — BASIC METABOLIC PANEL
BUN: 18 mg/dL (ref 6–23)
CHLORIDE: 105 meq/L (ref 96–112)
CO2: 29 meq/L (ref 19–32)
Calcium: 9.2 mg/dL (ref 8.4–10.5)
Creatinine, Ser: 0.7 mg/dL (ref 0.4–1.2)
GFR: 119.12 mL/min (ref >60.00)
Glucose, Bld: 82 mg/dL (ref 70–99)
Potassium: 4.7 mEq/L (ref 3.5–5.1)
Sodium: 139 mEq/L (ref 135–145)

## 2014-04-20 LAB — VITAMIN D 25 HYDROXY (VIT D DEFICIENCY, FRACTURES): VITD: 32.76 ng/mL (ref 30.00–100.00)

## 2014-04-20 LAB — HEPATIC FUNCTION PANEL
ALBUMIN: 3.7 g/dL (ref 3.5–5.2)
ALK PHOS: 47 U/L (ref 39–117)
ALT: 14 U/L (ref 0–35)
AST: 18 U/L (ref 0–37)
BILIRUBIN DIRECT: 0.1 mg/dL (ref 0.0–0.3)
TOTAL PROTEIN: 6.9 g/dL (ref 6.0–8.3)
Total Bilirubin: 0.6 mg/dL (ref 0.2–1.2)

## 2014-04-20 LAB — LIPID PANEL
CHOLESTEROL: 179 mg/dL (ref 0–200)
HDL: 58.8 mg/dL (ref >39.00)
LDL CALC: 113 mg/dL — AB (ref 0–99)
NonHDL: 120.2
Total CHOL/HDL Ratio: 3
Triglycerides: 37 mg/dL (ref 0.0–149.0)
VLDL: 7.4 mg/dL (ref 0.0–40.0)

## 2014-04-20 LAB — TSH: TSH: 1.33 u[IU]/mL (ref 0.35–4.50)

## 2014-04-27 ENCOUNTER — Encounter: Payer: Self-pay | Admitting: Family Medicine

## 2014-04-27 ENCOUNTER — Ambulatory Visit (INDEPENDENT_AMBULATORY_CARE_PROVIDER_SITE_OTHER): Payer: BC Managed Care – PPO | Admitting: Family Medicine

## 2014-04-27 VITALS — BP 122/85 | HR 81 | Temp 98.4°F | Ht 66.5 in | Wt 253.0 lb

## 2014-04-27 DIAGNOSIS — Z23 Encounter for immunization: Secondary | ICD-10-CM

## 2014-04-27 DIAGNOSIS — Z0000 Encounter for general adult medical examination without abnormal findings: Secondary | ICD-10-CM

## 2014-04-27 NOTE — Progress Notes (Signed)
Pre visit review using our clinic review tool, if applicable. No additional management support is needed unless otherwise documented below in the visit note. 

## 2014-04-27 NOTE — Progress Notes (Signed)
   Subjective:    Patient ID: Pamela Lopez, female    DOB: 1975-10-05, 38 y.o.   MRN: [id number]  HPI 38 yr old female for a cpx. She feels well. Her asthma has not bothered her in quite a while. Her migraines are much better controlled and she has been able to stop taking Topamax.    Review of Systems  Constitutional: Negative.   HENT: Negative.   Eyes: Negative.   Respiratory: Negative.   Cardiovascular: Negative.   Gastrointestinal: Negative.   Genitourinary: Negative for dysuria, urgency, frequency, hematuria, flank pain, decreased urine volume, enuresis, difficulty urinating, pelvic pain and dyspareunia.  Musculoskeletal: Negative.   Skin: Negative.   Neurological: Negative.   Psychiatric/Behavioral: Negative.        Objective:   Physical Exam  Constitutional: She is oriented to person, place, and time. She appears well-developed and well-nourished. No distress.  HENT:  Head: Normocephalic and atraumatic.  Right Ear: External ear normal.  Left Ear: External ear normal.  Nose: Nose normal.  Mouth/Throat: Oropharynx is clear and moist. No oropharyngeal exudate.  Eyes: Conjunctivae and EOM are normal. Pupils are equal, round, and reactive to light. No scleral icterus.  Neck: Normal range of motion. Neck supple. No JVD present. No thyromegaly present.  Cardiovascular: Normal rate, regular rhythm, normal heart sounds and intact distal pulses.  Exam reveals no gallop and no friction rub.   No murmur heard. Pulmonary/Chest: Effort normal and breath sounds normal. No respiratory distress. She has no wheezes. She has no rales. She exhibits no tenderness.  Abdominal: Soft. Bowel sounds are normal. She exhibits no distension and no mass. There is no tenderness. There is no rebound and no guarding.  Musculoskeletal: Normal range of motion. She exhibits no edema and no tenderness.  Lymphadenopathy:    She has no cervical adenopathy.  Neurological: She is alert and oriented to  person, place, and time. She has normal reflexes. No cranial nerve deficit. She exhibits normal muscle tone. Coordination normal.  Skin: Skin is warm and dry. No rash noted. No erythema.  Psychiatric: She has a normal mood and affect. Her behavior is normal. Judgment and thought content normal.          Assessment & Plan:  Well exam. We discussed the importance of daily exercise and losing weight.

## 2014-05-23 ENCOUNTER — Emergency Department (HOSPITAL_COMMUNITY)
Admission: EM | Admit: 2014-05-23 | Discharge: 2014-05-23 | Disposition: A | Discharge status: Home / Self Care | Payer: BC Managed Care – PPO | Source: Home / Self Care | Attending: Family Medicine | Admitting: Family Medicine

## 2014-05-23 ENCOUNTER — Encounter (HOSPITAL_COMMUNITY): Payer: Self-pay | Admitting: Emergency Medicine

## 2014-05-23 ENCOUNTER — Telehealth: Payer: Self-pay | Admitting: Family Medicine

## 2014-05-23 DIAGNOSIS — R590 Localized enlarged lymph nodes: Secondary | ICD-10-CM

## 2014-05-23 DIAGNOSIS — R599 Enlarged lymph nodes, unspecified: Secondary | ICD-10-CM

## 2014-05-23 MED ORDER — DICLOFENAC SODIUM 50 MG PO TBEC: 50.0000 mg | DELAYED_RELEASE_TABLET | Freq: Two times a day (BID) | ORAL | Status: DC | PRN

## 2014-05-23 NOTE — Discharge Instructions (Signed)
Thank you for coming in today. Take diclofenac twice daily for pain as needed. Followup with primary care provider/   Lymphadenopathy Lymphadenopathy means "disease of the lymph glands." But the term is usually used to describe swollen or enlarged lymph glands, also called lymph nodes. These are the bean-shaped organs found in many locations including the neck, underarm, and groin. Lymph glands are part of the immune system, which fights infections in your body. Lymphadenopathy can occur in just one area of the body, such as the neck, or it can be generalized, with lymph node enlargement in several areas. The nodes found in the neck are the most common sites of lymphadenopathy. CAUSES When your immune system responds to germs (such as viruses or bacteria ), infection-fighting cells and fluid build up. This causes the glands to grow in size. Usually, this is not something to worry about. Sometimes, the glands themselves can become infected and inflamed. This is called lymphadenitis. Enlarged lymph nodes can be caused by many diseases:  Bacterial disease, such as strep throat or a skin infection.  Viral disease, such as a common cold.  Other germs, such as Lyme disease, tuberculosis, or sexually transmitted diseases.  Cancers, such as lymphoma (cancer of the lymphatic system) or leukemia (cancer of the white blood cells).  Inflammatory diseases such as lupus or rheumatoid arthritis.  Reactions to medications. Many of the diseases above are rare, but important. This is why you should see your caregiver if you have lymphadenopathy. SYMPTOMS  Swollen, enlarged lumps in the neck, back of the head, or other locations.  Tenderness.  Warmth or redness of the skin over the lymph nodes.  Fever. DIAGNOSIS Enlarged lymph nodes are often near the source of infection. They can help health care providers diagnose your illness. For instance:  Swollen lymph nodes around the jaw might be caused by an  infection in the mouth.  Enlarged glands in the neck often signal a throat infection.  Lymph nodes that are swollen in more than one area often indicate an illness caused by a virus. Your caregiver will likely know what is causing your lymphadenopathy after listening to your history and examining you. Blood tests, x-rays, or other tests may be needed. If the cause of the enlarged lymph node cannot be found, and it does not go away by itself, then a biopsy may be needed. Your caregiver will discuss this with you. TREATMENT Treatment for your enlarged lymph nodes will depend on the cause. Many times the nodes will shrink to normal size by themselves, with no treatment. Antibiotics or other medicines may be needed for infection. Only take over-the-counter or prescription medicines for pain, discomfort, or fever as directed by your caregiver. HOME CARE INSTRUCTIONS Swollen lymph glands usually return to normal when the underlying medical condition goes away. If they persist, contact your health-care provider. He/she might prescribe antibiotics or other treatments, depending on the diagnosis. Take any medications exactly as prescribed. Keep any follow-up appointments made to check on the condition of your enlarged nodes. SEEK MEDICAL CARE IF:  Swelling lasts for more than two weeks.  You have symptoms such as weight loss, night sweats, fatigue, or fever that does not go away.  The lymph nodes are hard, seem fixed to the skin, or are growing rapidly.  Skin over the lymph nodes is red and inflamed. This could mean there is an infection. SEEK IMMEDIATE MEDICAL CARE IF:  Fluid starts leaking from the area of the enlarged lymph node.  You  develop a fever of 102 F (38.9 C) or greater.  Severe pain develops (not necessarily at the site of a large lymph node).  You develop chest pain or shortness of breath.  You develop worsening abdominal pain. MAKE SURE YOU:  Understand these  instructions.  Will watch your condition.  Will get help right away if you are not doing well or get worse. Document Released: 05/14/2008 Document Revised: 12/20/2013 Document Reviewed: 05/14/2008 [facility] Patient Information 2015 [place], [place]. This information is not intended to replace advice given to you by your health care provider. Make sure you discuss any questions you have with your health care provider.
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
## 2014-05-23 NOTE — Telephone Encounter (Signed)
I see she went to Urgent Care today for this. Have her follow up with me in one week

## 2014-05-23 NOTE — Telephone Encounter (Signed)
Pt states the lymph node on the back of her neck (left side) has returned. It is very painful. Pt was to inform fr fry if it came back. Pt would like to know what to do next

## 2014-05-23 NOTE — ED Notes (Signed)
Pt is here today because of a lump on the back of his neck, pt said that the lump has been present for about 2 months and has been causing a significant amount of pain when she looks up or down

## 2014-05-24 NOTE — Telephone Encounter (Signed)
I left a voice message with the below information.

## 2014-06-02 ENCOUNTER — Ambulatory Visit (INDEPENDENT_AMBULATORY_CARE_PROVIDER_SITE_OTHER): Payer: BC Managed Care – PPO | Admitting: Family Medicine

## 2014-06-02 ENCOUNTER — Encounter: Payer: Self-pay | Admitting: Family Medicine

## 2014-06-02 VITALS — BP 116/82 | HR 82 | Temp 98.8°F | Ht 66.5 in | Wt 262.0 lb

## 2014-06-02 DIAGNOSIS — L03221 Cellulitis of neck: Secondary | ICD-10-CM

## 2014-06-02 MED ORDER — CEPHALEXIN 500 MG PO CAPS: 500.0000 mg | ORAL_CAPSULE | Freq: Three times a day (TID) | ORAL | Status: AC

## 2014-06-02 NOTE — Progress Notes (Signed)
   Subjective:    Patient ID: Pamela Lopez, female    DOB: Jan 10, 1976, 38 y.o.   MRN: [id number]  HPI Here to follow up recurrent swollen lymph nodes on the left posterior neck. We saw her for this in July with no clear etiology, and it resolved on its won. Then it returned about 2 weeks ago in the same area. She went to Urgent Care on 05-23-14 and again no etiology was found and she was told to follow up with [place]. Now for the past few days the lymph nodes has gone back down and she feels fine. I noticed a piercing on the left ear lobe with a stud in it and asked her about this. She has had this in for about 6 months.    Review of Systems  Constitutional: Negative.   HENT: Negative.   Eyes: Negative.   Respiratory: Negative.   Hematological: Positive for adenopathy.       Objective:   Physical Exam  Constitutional: She appears well-developed and well-nourished.  HENT:  Head: Normocephalic and atraumatic.  Right Ear: External ear normal.  Nose: Nose normal.  Mouth/Throat: Oropharynx is clear and moist.  The left ear lobe is red and warm, though not tender. She has a stud piercing in the upper lobe. The skin behind the ear is also red and warm, and this erythema extends down th left posterior neck  Eyes: Conjunctivae are normal.  Neck: Neck supple.  Single tender small left posterior cervical node           Assessment & Plan:  Her adenopathy is reacting to some cellulitis on the ear resulting from the piercing. Treat with Keflex, and I advised her to remove the stud from her ear lobe.

## 2014-06-20 ENCOUNTER — Encounter: Payer: Self-pay | Admitting: Family Medicine

## 2014-07-08 ENCOUNTER — Ambulatory Visit: Payer: BC Managed Care – PPO | Admitting: Family Medicine

## 2014-08-26 ENCOUNTER — Ambulatory Visit (INDEPENDENT_AMBULATORY_CARE_PROVIDER_SITE_OTHER): Payer: BLUE CROSS/BLUE SHIELD | Admitting: Family Medicine

## 2014-08-26 ENCOUNTER — Encounter: Payer: Self-pay | Admitting: Family Medicine

## 2014-08-26 VITALS — BP 127/92 | HR 81 | Temp 98.3°F | Ht 66.5 in | Wt 263.0 lb

## 2014-08-26 DIAGNOSIS — J0100 Acute maxillary sinusitis, unspecified: Secondary | ICD-10-CM

## 2014-08-26 MED ORDER — METHYLPREDNISOLONE ACETATE 80 MG/ML IJ SUSP
120.0000 mg | Freq: Once | INTRAMUSCULAR | Status: AC
  Administered 2014-08-26: 120 mg via INTRAMUSCULAR

## 2014-08-26 MED ORDER — FLUCONAZOLE 150 MG PO TABS: 150.0000 mg | ORAL_TABLET | Freq: Once | ORAL | Status: DC

## 2014-08-26 MED ORDER — AZITHROMYCIN 250 MG PO TABS: ORAL_TABLET | ORAL | Status: DC

## 2014-08-26 NOTE — Addendum Note (Signed)
Addended by: Aggie Hacker A on: 08/26/2014 09:36 AM   Modules accepted: Orders

## 2014-08-26 NOTE — Progress Notes (Signed)
Pre visit review using our clinic review tool, if applicable. No additional management support is needed unless otherwise documented below in the visit note. 

## 2014-08-26 NOTE — Progress Notes (Signed)
   Subjective:    Patient ID: Pamela Lopez, female    DOB: Feb 02, 1976, 39 y.o.   MRN: [id number]  HPI Here for one week of sinus pressure, PND, ear pain and ST. No cough. Using Mucinex D.   Review of Systems  Constitutional: Negative.   HENT: Positive for congestion, ear pain, postnasal drip and sinus pressure.   Eyes: Negative.   Respiratory: Negative.        Objective:   Physical Exam  Constitutional: She appears well-developed and well-nourished.  HENT:  Right Ear: External ear normal.  Left Ear: External ear normal.  Nose: Nose normal.  Mouth/Throat: Oropharynx is clear and moist.  Eyes: Conjunctivae are normal.  Pulmonary/Chest: Effort normal and breath sounds normal.  Lymphadenopathy:    She has no cervical adenopathy.          Assessment & Plan:  Recheck prn

## 2014-08-31 ENCOUNTER — Telehealth: Payer: Self-pay | Admitting: Family Medicine

## 2014-08-31 NOTE — Telephone Encounter (Signed)
Pt was seen on 1-8 for sinus inf and zpak has help a little. Pt would like a strong abx call into [facility] [address]

## 2014-09-01 MED ORDER — AMOXICILLIN-POT CLAVULANATE 875-125 MG PO TABS: 1.0000 | ORAL_TABLET | Freq: Two times a day (BID) | ORAL | Status: DC

## 2014-09-01 NOTE — Telephone Encounter (Signed)
I sent script e-scribe and spoke with pt. 

## 2014-09-01 NOTE — Telephone Encounter (Signed)
Cal in Augmentin 875 bid for 10 days

## 2014-10-06 ENCOUNTER — Other Ambulatory Visit: Payer: Self-pay | Admitting: Gastroenterology

## 2014-10-06 DIAGNOSIS — R110 Nausea: Secondary | ICD-10-CM

## 2014-10-06 DIAGNOSIS — R1011 Right upper quadrant pain: Secondary | ICD-10-CM

## 2014-10-23 ENCOUNTER — Encounter (HOSPITAL_COMMUNITY): Payer: Self-pay | Admitting: Emergency Medicine

## 2014-10-23 ENCOUNTER — Emergency Department (HOSPITAL_COMMUNITY)
Admission: EM | Admit: 2014-10-23 | Discharge: 2014-10-23 | Disposition: A | Discharge status: Home / Self Care | Payer: BLUE CROSS/BLUE SHIELD | Attending: Emergency Medicine | Admitting: Emergency Medicine

## 2014-10-23 ENCOUNTER — Emergency Department (HOSPITAL_COMMUNITY): Payer: BLUE CROSS/BLUE SHIELD

## 2014-10-23 DIAGNOSIS — Z8659 Personal history of other mental and behavioral disorders: Secondary | ICD-10-CM | POA: Diagnosis not present

## 2014-10-23 DIAGNOSIS — Z8739 Personal history of other diseases of the musculoskeletal system and connective tissue: Secondary | ICD-10-CM | POA: Insufficient documentation

## 2014-10-23 DIAGNOSIS — Z8669 Personal history of other diseases of the nervous system and sense organs: Secondary | ICD-10-CM | POA: Insufficient documentation

## 2014-10-23 DIAGNOSIS — Z8679 Personal history of other diseases of the circulatory system: Secondary | ICD-10-CM | POA: Diagnosis not present

## 2014-10-23 DIAGNOSIS — R05 Cough: Secondary | ICD-10-CM | POA: Diagnosis present

## 2014-10-23 DIAGNOSIS — Z872 Personal history of diseases of the skin and subcutaneous tissue: Secondary | ICD-10-CM | POA: Diagnosis not present

## 2014-10-23 DIAGNOSIS — J988 Other specified respiratory disorders: Secondary | ICD-10-CM | POA: Insufficient documentation

## 2014-10-23 DIAGNOSIS — J45909 Unspecified asthma, uncomplicated: Secondary | ICD-10-CM | POA: Insufficient documentation

## 2014-10-23 DIAGNOSIS — Z8639 Personal history of other endocrine, nutritional and metabolic disease: Secondary | ICD-10-CM | POA: Insufficient documentation

## 2014-10-23 DIAGNOSIS — Z880 Allergy status to penicillin: Secondary | ICD-10-CM | POA: Diagnosis not present

## 2014-10-23 DIAGNOSIS — Z8619 Personal history of other infectious and parasitic diseases: Secondary | ICD-10-CM | POA: Insufficient documentation

## 2014-10-23 DIAGNOSIS — Z79899 Other long term (current) drug therapy: Secondary | ICD-10-CM | POA: Insufficient documentation

## 2014-10-23 DIAGNOSIS — Z8742 Personal history of other diseases of the female genital tract: Secondary | ICD-10-CM | POA: Diagnosis not present

## 2014-10-23 LAB — CBC WITH DIFFERENTIAL/PLATELET
Basophils Absolute: 0 10*3/uL (ref 0.0–0.1)
Basophils Relative: 0 % (ref 0–1)
Eosinophils Absolute: 0.1 10*3/uL (ref 0.0–0.7)
Eosinophils Relative: 1 % (ref 0–5)
HCT: 38.3 % (ref 36.0–46.0)
Hemoglobin: 12.8 g/dL (ref 12.0–15.0)
Lymphocytes Relative: 8 % — ABNORMAL LOW (ref 12–46)
Lymphs Abs: 0.6 10*3/uL — ABNORMAL LOW (ref 0.7–4.0)
MCH: 28.4 pg (ref 26.0–34.0)
MCHC: 33.4 g/dL (ref 30.0–36.0)
MCV: 84.9 fL (ref 78.0–100.0)
Monocytes Absolute: 1.1 10*3/uL — ABNORMAL HIGH (ref 0.1–1.0)
Monocytes Relative: 14 % — ABNORMAL HIGH (ref 3–12)
Neutro Abs: 6 10*3/uL (ref 1.7–7.7)
Neutrophils Relative %: 77 % (ref 43–77)
Platelets: 207 10*3/uL (ref 150–400)
RBC: 4.51 MIL/uL (ref 3.87–5.11)
RDW: 14.1 % (ref 11.5–15.5)
WBC: 7.7 10*3/uL (ref 4.0–10.5)

## 2014-10-23 LAB — URINALYSIS, ROUTINE W REFLEX MICROSCOPIC
Bilirubin Urine: NEGATIVE
Glucose, UA: NEGATIVE mg/dL
Hgb urine dipstick: NEGATIVE
Ketones, ur: NEGATIVE mg/dL
Leukocytes, UA: NEGATIVE
Nitrite: NEGATIVE
Protein, ur: NEGATIVE mg/dL
Specific Gravity, Urine: 1.018 (ref 1.005–1.030)
Urobilinogen, UA: 1 mg/dL (ref 0.0–1.0)
pH: 8 (ref 5.0–8.0)

## 2014-10-23 LAB — BASIC METABOLIC PANEL
Anion gap: 7 (ref 5–15)
BUN: 9 mg/dL (ref 6–23)
CHLORIDE: 101 mmol/L (ref 96–112)
CO2: 28 mmol/L (ref 19–32)
Calcium: 9.7 mg/dL (ref 8.4–10.5)
Creatinine, Ser: 0.82 mg/dL (ref 0.50–1.10)
GFR calc Af Amer: >90 mL/min (ref >90)
GLUCOSE: 82 mg/dL (ref 70–99)
POTASSIUM: 3.4 mmol/L — AB (ref 3.5–5.1)
SODIUM: 136 mmol/L (ref 135–145)

## 2014-10-23 MED ORDER — ACETAMINOPHEN 325 MG PO TABS
ORAL_TABLET | ORAL | Status: AC
  Filled 2014-10-23: qty 1

## 2014-10-23 MED ORDER — SODIUM CHLORIDE 0.9 % IV BOLUS (SEPSIS)
1000.0000 mL | Freq: Once | INTRAVENOUS | Status: AC
  Administered 2014-10-23: 1000 mL via INTRAVENOUS

## 2014-10-23 MED ORDER — ACETAMINOPHEN 325 MG PO TABS
650.0000 mg | ORAL_TABLET | Freq: Once | ORAL | Status: AC
  Administered 2014-10-23: 650 mg via ORAL
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
## 2014-10-23 NOTE — Discharge Instructions (Signed)

## 2014-10-23 NOTE — ED Notes (Signed)
Pt c/o cough, fever, body aches and HA; pt sts pain with cough x 2 days

## 2014-10-23 NOTE — ED Notes (Signed)
Patient transported to X-ray 

## 2014-10-23 NOTE — ED Notes (Signed)
PA Jeff at bedside.

## 2014-10-24 ENCOUNTER — Telehealth: Payer: Self-pay | Admitting: Family Medicine

## 2014-10-24 MED ORDER — BENZONATATE 200 MG PO CAPS: 200.0000 mg | ORAL_CAPSULE | Freq: Two times a day (BID) | ORAL | Status: DC | PRN

## 2014-10-24 MED ORDER — ALBUTEROL SULFATE HFA 108 (90 BASE) MCG/ACT IN AERS: 2.0000 | INHALATION_SPRAY | RESPIRATORY_TRACT | Status: DC | PRN

## 2014-10-24 NOTE — Telephone Encounter (Signed)
She needs an OV for this  

## 2014-10-24 NOTE — Telephone Encounter (Signed)
There is no other substitute, if pt wants a cough syrup then she will need a office visit. I spoke with pt and went over this information.

## 2014-10-24 NOTE — Telephone Encounter (Signed)
I spoke with pt and she would have scheduled appointment, however you are full today. She would like something for the cough and needs refill on Albuterol inhaler. Per Dr. Sarajane Jews okay to refill inhaler and call in Tessalon 200 mg take 1 po bid prn # 60 with 0 refills. [facility] and spoke with [facility].

## 2014-10-24 NOTE — Telephone Encounter (Signed)
Pt went to ed yesterday, diagnosed w/ upper resp inf.  Pt would like to know if dr fry will call her anything in [facility].  Pt advised by ed to fu in 2 days. Pt states her fever is 100.4 but she doesn't want to wait until tomorrow. Would like rx today.

## 2014-10-24 NOTE — Telephone Encounter (Signed)
Pt called and said the following med is not covered by her insurance. benzonatate (TESSALON) 200 MG capsule  She need to have something else called in   [place]

## 2014-11-03 ENCOUNTER — Ambulatory Visit (HOSPITAL_COMMUNITY)
Admission: RE | Admit: 2014-11-03 | Discharge: 2014-11-03 | Disposition: A | Discharge status: Home / Self Care | Payer: BLUE CROSS/BLUE SHIELD | Source: Ambulatory Visit | Attending: Gastroenterology | Admitting: Gastroenterology

## 2014-11-03 ENCOUNTER — Encounter (HOSPITAL_COMMUNITY)
Admission: RE | Admit: 2014-11-03 | Discharge: 2014-11-03 | Disposition: A | Discharge status: Home / Self Care | Payer: BLUE CROSS/BLUE SHIELD | Source: Ambulatory Visit | Attending: Gastroenterology | Admitting: Gastroenterology

## 2014-11-03 DIAGNOSIS — R110 Nausea: Secondary | ICD-10-CM | POA: Diagnosis not present

## 2014-11-03 DIAGNOSIS — R1011 Right upper quadrant pain: Secondary | ICD-10-CM | POA: Diagnosis present

## 2014-11-03 MED ORDER — SINCALIDE 5 MCG IJ SOLR: 0.0200 ug/kg | Freq: Once | INTRAMUSCULAR | Status: DC

## 2014-11-03 MED ORDER — TECHNETIUM TC 99M MEBROFENIN IV KIT
5.3000 | PACK | Freq: Once | INTRAVENOUS | Status: AC | PRN
  Administered 2014-11-03: 5 via INTRAVENOUS

## 2015-05-26 ENCOUNTER — Ambulatory Visit (INDEPENDENT_AMBULATORY_CARE_PROVIDER_SITE_OTHER): Payer: BLUE CROSS/BLUE SHIELD | Admitting: Family Medicine

## 2015-05-26 DIAGNOSIS — Z23 Encounter for immunization: Secondary | ICD-10-CM

## 2015-08-31 ENCOUNTER — Ambulatory Visit (INDEPENDENT_AMBULATORY_CARE_PROVIDER_SITE_OTHER): Payer: BLUE CROSS/BLUE SHIELD | Admitting: Family Medicine

## 2015-08-31 ENCOUNTER — Encounter: Payer: Self-pay | Admitting: Family Medicine

## 2015-08-31 VITALS — BP 139/93 | HR 83 | Temp 98.6°F | Ht 66.0 in | Wt 280.0 lb

## 2015-08-31 DIAGNOSIS — J019 Acute sinusitis, unspecified: Secondary | ICD-10-CM | POA: Diagnosis not present

## 2015-08-31 MED ORDER — ALBUTEROL SULFATE HFA 108 (90 BASE) MCG/ACT IN AERS: 2.0000 | INHALATION_SPRAY | RESPIRATORY_TRACT | Status: DC | PRN

## 2015-08-31 MED ORDER — METHYLPREDNISOLONE ACETATE 80 MG/ML IJ SUSP
120.0000 mg | Freq: Once | INTRAMUSCULAR | Status: AC
  Administered 2015-08-31: 120 mg via INTRAMUSCULAR

## 2015-08-31 MED ORDER — AZITHROMYCIN 250 MG PO TABS: ORAL_TABLET | ORAL | Status: DC

## 2015-08-31 MED ORDER — FLUCONAZOLE 150 MG PO TABS
150.0000 mg | ORAL_TABLET | Freq: Once | ORAL | Status: DC
Start: 2015-08-31 — End: 2016-02-02

## 2015-08-31 NOTE — Progress Notes (Signed)
Pre visit review using our clinic review tool, if applicable. No additional management support is needed unless otherwise documented below in the visit note. 

## 2015-08-31 NOTE — Progress Notes (Signed)
   Subjective:    Patient ID: Pamela Lopez, female    DOB: 30-Aug-1975, 40 y.o.   MRN: [id number]  HPI Here for 5 days of sinus pressure, PND, ST, and coughing up yellow sputum. No fever. On Robitussin.    Review of Systems  Constitutional: Negative.   HENT: Positive for congestion, postnasal drip, sinus pressure and sore throat. Negative for ear pain.   Eyes: Negative.   Respiratory: Positive for cough.        Objective:   Physical Exam  Constitutional: She appears well-developed and well-nourished.  HENT:  Right Ear: External ear normal.  Left Ear: External ear normal.  Nose: Nose normal.  Mouth/Throat: Oropharynx is clear and moist.  Eyes: Conjunctivae are normal.  Neck: No thyromegaly present.  Pulmonary/Chest: Effort normal and breath sounds normal. No respiratory distress. She has no wheezes. She has no rales.  Lymphadenopathy:    She has no cervical adenopathy.          Assessment & Plan:  Sinusitis, treat with a Zpack and a steroid shot

## 2015-08-31 NOTE — Addendum Note (Signed)
Addended by: Aggie Hacker A on: 08/31/2015 09:08 AM   Modules accepted: Orders

## 2015-12-26 ENCOUNTER — Other Ambulatory Visit: Payer: Self-pay | Admitting: Obstetrics and Gynecology

## 2016-01-26 ENCOUNTER — Other Ambulatory Visit (INDEPENDENT_AMBULATORY_CARE_PROVIDER_SITE_OTHER): Payer: BLUE CROSS/BLUE SHIELD

## 2016-01-26 DIAGNOSIS — Z0000 Encounter for general adult medical examination without abnormal findings: Secondary | ICD-10-CM | POA: Diagnosis not present

## 2016-01-26 LAB — CBC WITH DIFFERENTIAL/PLATELET
Basophils Absolute: 0 10*3/uL (ref 0.0–0.1)
Basophils Relative: 0.4 % (ref 0.0–3.0)
EOS PCT: 0.4 % (ref 0.0–5.0)
Eosinophils Absolute: 0 10*3/uL (ref 0.0–0.7)
HCT: 37 % (ref 36.0–46.0)
Hemoglobin: 12.2 g/dL (ref 12.0–15.0)
Lymphocytes Relative: 27.4 % (ref 12.0–46.0)
Lymphs Abs: 1.8 10*3/uL (ref 0.7–4.0)
MCHC: 33.1 g/dL (ref 30.0–36.0)
MCV: 84.4 fl (ref 78.0–100.0)
MONOS PCT: 7.6 % (ref 3.0–12.0)
Monocytes Absolute: 0.5 10*3/uL (ref 0.1–1.0)
NEUTROS ABS: 4.2 10*3/uL (ref 1.4–7.7)
NEUTROS PCT: 64.2 % (ref 43.0–77.0)
Platelets: 208 10*3/uL (ref 150.0–400.0)
RBC: 4.39 Mil/uL (ref 3.87–5.11)
RDW: 14.4 % (ref 11.5–15.5)
WBC: 6.5 10*3/uL (ref 4.0–10.5)

## 2016-01-26 LAB — HEPATIC FUNCTION PANEL
ALBUMIN: 4 g/dL (ref 3.5–5.2)
ALT: 10 U/L (ref 0–35)
AST: 11 U/L (ref 0–37)
Alkaline Phosphatase: 51 U/L (ref 39–117)
Bilirubin, Direct: 0.1 mg/dL (ref 0.0–0.3)
Total Bilirubin: 0.4 mg/dL (ref 0.2–1.2)
Total Protein: 6.9 g/dL (ref 6.0–8.3)

## 2016-01-26 LAB — LIPID PANEL
Cholesterol: 173 mg/dL (ref 0–200)
HDL: 56.3 mg/dL (ref >39.00)
LDL Cholesterol: 106 mg/dL — ABNORMAL HIGH (ref 0–99)
NonHDL: 116.57
TRIGLYCERIDES: 51 mg/dL (ref 0.0–149.0)
Total CHOL/HDL Ratio: 3
VLDL: 10.2 mg/dL (ref 0.0–40.0)

## 2016-01-26 LAB — BASIC METABOLIC PANEL
BUN: 18 mg/dL (ref 6–23)
CALCIUM: 9 mg/dL (ref 8.4–10.5)
CO2: 28 meq/L (ref 19–32)
CREATININE: 0.76 mg/dL (ref 0.40–1.20)
Chloride: 102 mEq/L (ref 96–112)
GFR: 109.09 mL/min (ref >60.00)
GLUCOSE: 84 mg/dL (ref 70–99)
Potassium: 4.3 mEq/L (ref 3.5–5.1)
Sodium: 137 mEq/L (ref 135–145)

## 2016-01-26 LAB — POC URINALSYSI DIPSTICK (AUTOMATED)
Bilirubin, UA: NEGATIVE
Glucose, UA: NEGATIVE
Ketones, UA: NEGATIVE
LEUKOCYTES UA: NEGATIVE
NITRITE UA: NEGATIVE
PH UA: 7
PROTEIN UA: NEGATIVE
Spec Grav, UA: 1.02
Urobilinogen, UA: 0.2

## 2016-01-26 LAB — TSH: TSH: 1.67 u[IU]/mL (ref 0.35–4.50)

## 2016-02-02 ENCOUNTER — Encounter: Payer: Self-pay | Admitting: Family Medicine

## 2016-02-02 ENCOUNTER — Ambulatory Visit (INDEPENDENT_AMBULATORY_CARE_PROVIDER_SITE_OTHER): Payer: BLUE CROSS/BLUE SHIELD | Admitting: Family Medicine

## 2016-02-02 VITALS — BP 144/90 | Temp 98.2°F | Ht 66.0 in | Wt 272.0 lb

## 2016-02-02 DIAGNOSIS — Z0000 Encounter for general adult medical examination without abnormal findings: Secondary | ICD-10-CM | POA: Diagnosis not present

## 2016-02-02 MED ORDER — LISINOPRIL-HYDROCHLOROTHIAZIDE 10-12.5 MG PO TABS: 1.0000 | ORAL_TABLET | Freq: Every day | ORAL | Status: DC

## 2016-02-02 NOTE — Progress Notes (Signed)
   Subjective:    Patient ID: Pamela Lopez, female    DOB: [birth year], 40 y.o.   MRN: [id number]  HPI 40 yr old female for a well exam. She feels well. She recently had an endometrial biopsy per her GYN to evaluate heavy menses, and this was normal. She had a workup in March 2016 per Dr. Collene Mares for abdominal pain and nausea, and this included a normal HIDA scan. 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 Over the past year she has put on about 35 lbs and her BP has gone up.    Review of Systems  Constitutional: Negative.   HENT: Negative.   Eyes: Negative.   Respiratory: Negative.   Cardiovascular: Negative.   Gastrointestinal: Negative.   Genitourinary: Negative for dysuria, urgency, frequency, hematuria, flank pain, decreased urine volume, enuresis, difficulty urinating, pelvic pain and dyspareunia.  Musculoskeletal: Negative.   Skin: Negative.   Neurological: Negative.   Psychiatric/Behavioral: Negative.        Objective:   Physical Exam  Constitutional: She is oriented to person, place, and time. She appears well-developed and well-nourished. No distress.  HENT:  Head: Normocephalic and atraumatic.  Right Ear: External ear normal.  Left Ear: External ear normal.  Nose: Nose normal.  Mouth/Throat: Oropharynx is clear and moist. No oropharyngeal exudate.  Eyes: Conjunctivae and EOM are normal. Pupils are equal, round, and reactive to light. No scleral icterus.  Neck: Normal range of motion. Neck supple. No JVD present. No thyromegaly present.  Cardiovascular: Normal rate, regular rhythm, normal heart sounds and intact distal pulses.  Exam reveals no gallop and no friction rub.   No murmur heard. Pulmonary/Chest: Effort normal and breath sounds normal. No respiratory distress. She has no wheezes. She has no rales. She exhibits no tenderness.  Abdominal: Soft. Bowel sounds are normal. She exhibits  no distension and no mass. There is no tenderness. There is no rebound and no guarding.  Musculoskeletal: Normal range of motion. She exhibits no edema or tenderness.  Lymphadenopathy:    She has no cervical adenopathy.  Neurological: She is alert and oriented to person, place, and time. She has normal reflexes. No cranial nerve deficit. She exhibits normal muscle tone. Coordination normal.  Skin: Skin is warm and dry. No rash noted. No erythema.  Psychiatric: She has a normal mood and affect. Her behavior is normal. Judgment and thought content normal.          Assessment & Plan:  Well exam. We discussed diet and exercise. Start on Lisinopril HCT. She will recheck here in one month. I also advised her to contact Dr. Collene Mares about follow up for the abnormal liver US.  Laurey Morale, MD

## 2016-02-02 NOTE — Progress Notes (Signed)
Pre visit review using our clinic review tool, if applicable. No additional management support is needed unless otherwise documented below in the visit note. 

## 2016-02-05 ENCOUNTER — Telehealth: Payer: Self-pay | Admitting: Family Medicine

## 2016-02-05 MED ORDER — LISINOPRIL 10 MG PO TABS: 10.0000 mg | ORAL_TABLET | Freq: Every day | ORAL | Status: DC

## 2016-02-05 NOTE — Telephone Encounter (Signed)
The   lisinopril-hydrochlorothiazide (PRINZIDE,ZESTORETIC) 10-12.5 MG tablet    med pt started last Friday has mad her dizzy, headaches, can barely work. Pt states she needs to change this med.  [facility]

## 2016-02-05 NOTE — Telephone Encounter (Signed)
Noted. This is probably due to the HCTZ in it. Stop this and switch to plain Lisinopril 10 mg daily. Call in #30 with 2 rf

## 2016-02-05 NOTE — Telephone Encounter (Signed)
I spoke with pt, updated medication list and [facility]

## 2016-02-08 ENCOUNTER — Other Ambulatory Visit: Payer: Self-pay | Admitting: Gastroenterology

## 2016-02-08 DIAGNOSIS — R110 Nausea: Secondary | ICD-10-CM

## 2016-02-12 ENCOUNTER — Other Ambulatory Visit: Payer: Self-pay | Admitting: Gastroenterology

## 2016-02-12 DIAGNOSIS — R112 Nausea with vomiting, unspecified: Secondary | ICD-10-CM

## 2016-02-12 DIAGNOSIS — R935 Abnormal findings on diagnostic imaging of other abdominal regions, including retroperitoneum: Secondary | ICD-10-CM

## 2016-02-16 ENCOUNTER — Ambulatory Visit
Admission: RE | Admit: 2016-02-16 | Discharge: 2016-02-16 | Disposition: A | Discharge status: Home / Self Care | Payer: BLUE CROSS/BLUE SHIELD | Source: Ambulatory Visit | Attending: Gastroenterology | Admitting: Gastroenterology

## 2016-02-16 DIAGNOSIS — R935 Abnormal findings on diagnostic imaging of other abdominal regions, including retroperitoneum: Secondary | ICD-10-CM

## 2016-02-16 DIAGNOSIS — R112 Nausea with vomiting, unspecified: Secondary | ICD-10-CM

## 2016-03-01 ENCOUNTER — Ambulatory Visit (HOSPITAL_COMMUNITY)
Admission: RE | Admit: 2016-03-01 | Discharge: 2016-03-01 | Disposition: A | Discharge status: Home / Self Care | Payer: BLUE CROSS/BLUE SHIELD | Source: Ambulatory Visit | Attending: Gastroenterology | Admitting: Gastroenterology

## 2016-03-01 DIAGNOSIS — R110 Nausea: Secondary | ICD-10-CM | POA: Insufficient documentation

## 2016-03-01 MED ORDER — TECHNETIUM TC 99M SULFUR COLLOID FILTERED
1.9000 | Freq: Once | INTRAVENOUS | Status: AC | PRN
  Administered 2016-03-01: 1.9 via INTRADERMAL

## 2016-03-12 ENCOUNTER — Encounter: Payer: Self-pay | Admitting: Family Medicine

## 2016-03-12 ENCOUNTER — Ambulatory Visit (INDEPENDENT_AMBULATORY_CARE_PROVIDER_SITE_OTHER): Payer: BLUE CROSS/BLUE SHIELD | Admitting: Family Medicine

## 2016-03-12 VITALS — BP 126/84 | Temp 97.9°F | Ht 66.0 in | Wt 284.0 lb

## 2016-03-12 DIAGNOSIS — R609 Edema, unspecified: Secondary | ICD-10-CM | POA: Diagnosis not present

## 2016-03-12 DIAGNOSIS — I10 Essential (primary) hypertension: Secondary | ICD-10-CM

## 2016-03-12 MED ORDER — FUROSEMIDE 20 MG PO TABS: 20.0000 mg | ORAL_TABLET | Freq: Every day | ORAL | 3 refills | Status: DC

## 2016-03-12 NOTE — Progress Notes (Signed)
   Subjective:    Patient ID: Pamela Lopez, female    DOB: 1975/08/20, 40 y.o.   MRN: [id number]  HPI Here to follow up on HTN. She had a cpx here last month and her weight was  Up to 272 lbs and her BP was up. She started on Lisinopril but she stopped taking this after 2 weeks because it made her feel lightheaded and dizzy. Over the past 5 weeks she has experienced a lot of fluid retention, her feet and hands are swelling, and she feels bloated all over. No chest pain or SOB. Her labs last month were normal, including renal function. She tries to watch her sodium intake closely.    Review of Systems  Constitutional: Negative.   Respiratory: Negative.   Cardiovascular: Positive for leg swelling. Negative for chest pain and palpitations.  Neurological: Positive for dizziness and light-headedness.       Objective:   Physical Exam  Constitutional: She is oriented to person, place, and time.  Obese   Neck: No thyromegaly present.  Cardiovascular: Normal rate, regular rhythm, normal heart sounds and intact distal pulses.   Pulmonary/Chest: Effort normal and breath sounds normal.  Musculoskeletal:  2+ edema in the ankles  Lymphadenopathy:    She has no cervical adenopathy.  Neurological: She is alert and oriented to person, place, and time.          Assessment & Plan:  Her BP actually seems to be controlled but she is showing a lot of fluid retention. Start on Lasix 20 mg daily and recheck in 2-3 weeks.  Laurey Morale, MD

## 2016-03-12 NOTE — Progress Notes (Signed)
Pre visit review using our clinic review tool, if applicable. No additional management support is needed unless otherwise documented below in the visit note. 

## 2016-03-15 ENCOUNTER — Telehealth: Payer: Self-pay | Admitting: Family Medicine

## 2016-03-15 NOTE — Telephone Encounter (Signed)
Tell her to increase the Lasix to take 2 tabs (total of 40 mg ) each morning and contact us again next week

## 2016-03-15 NOTE — Telephone Encounter (Signed)
Pt states the l furosemide (LASIX) 20 MG tablet  is not making her urinate and she is still swollen.  [facility]

## 2016-03-15 NOTE — Telephone Encounter (Signed)
I spoke with pt and gave below directions. 

## 2016-03-29 ENCOUNTER — Ambulatory Visit: Payer: BLUE CROSS/BLUE SHIELD | Admitting: Family Medicine

## 2016-04-04 ENCOUNTER — Encounter: Payer: Self-pay | Admitting: Family Medicine

## 2016-04-04 ENCOUNTER — Ambulatory Visit (INDEPENDENT_AMBULATORY_CARE_PROVIDER_SITE_OTHER): Payer: BLUE CROSS/BLUE SHIELD | Admitting: Family Medicine

## 2016-04-04 VITALS — BP 131/81 | HR 80 | Temp 98.2°F | Ht 66.0 in | Wt 284.0 lb

## 2016-04-04 DIAGNOSIS — I10 Essential (primary) hypertension: Secondary | ICD-10-CM | POA: Diagnosis not present

## 2016-04-04 DIAGNOSIS — R600 Localized edema: Secondary | ICD-10-CM

## 2016-04-04 MED ORDER — FUROSEMIDE 20 MG PO TABS: 20.0000 mg | ORAL_TABLET | Freq: Two times a day (BID) | ORAL | 3 refills | Status: DC

## 2016-04-04 NOTE — Progress Notes (Signed)
Pre visit review using our clinic review tool, if applicable. No additional management support is needed unless otherwise documented below in the visit note. 

## 2016-04-04 NOTE — Progress Notes (Signed)
   Subjective:    Patient ID: Pamela Lopez, female    DOB: September 29, 1975, 40 y.o.   MRN: [id number]  HPI Here to follow up leg edema. She has been taking Lasix and this has helped. When she takes two 20 mg tabs at a time it works much better, but she has to urinate too frequently to do this on work days. She feels better and the leg swelling is down. She is seeing Orthopedics for plantar fasciitis right now.    Review of Systems  Constitutional: Negative.   Respiratory: Negative.   Cardiovascular: Positive for leg swelling. Negative for chest pain and palpitations.  Neurological: Negative.        Objective:   Physical Exam  Constitutional: She is oriented to person, place, and time. She appears well-developed and well-nourished.  Neck: No thyromegaly present.  Cardiovascular: Normal rate, regular rhythm, normal heart sounds and intact distal pulses.   Pulmonary/Chest: Effort normal and breath sounds normal.  Musculoskeletal:  Trace edema in both ankles  Lymphadenopathy:    She has no cervical adenopathy.  Neurological: She is alert and oriented to person, place, and time.          Assessment & Plan:  Leg edema, improved with Lasix. She will take the 20 mg Lasix bid on workdays and will take  Both tabs in the mornings on the weekends. Recheck prn.  Laurey Morale, MD

## 2016-05-31 ENCOUNTER — Encounter: Payer: Self-pay | Admitting: Adult Health

## 2016-05-31 ENCOUNTER — Ambulatory Visit (INDEPENDENT_AMBULATORY_CARE_PROVIDER_SITE_OTHER): Payer: BLUE CROSS/BLUE SHIELD | Admitting: Adult Health

## 2016-05-31 VITALS — BP 132/78 | Ht 66.0 in | Wt 282.2 lb

## 2016-05-31 DIAGNOSIS — J069 Acute upper respiratory infection, unspecified: Secondary | ICD-10-CM | POA: Diagnosis not present

## 2016-05-31 MED ORDER — FLUCONAZOLE 150 MG PO TABS
150.0000 mg | ORAL_TABLET | Freq: Once | ORAL | 1 refills | Status: AC
Start: 2016-05-31 — End: 2016-05-31

## 2016-05-31 MED ORDER — AZITHROMYCIN 250 MG PO TABS: ORAL_TABLET | ORAL | 0 refills | Status: DC

## 2016-05-31 NOTE — Patient Instructions (Signed)
It was great meeting you today!  Your exam is consistent with a upper respiratory infection/Sinus infection. This is likely a viral syndrome.   If you are not feeling any better in the next 3 days then you can start the antibiotics  In the meantime, start using Flonase.    General Recommendations:    Please drink plenty of fluids.  Get plenty of rest   Sleep in humidified air  Use saline nasal sprays  Netti pot   OTC Medications:  Decongestants - helps relieve congestion   Flonase (generic fluticasone) or Nasacort (generic triamcinolone) - please make sure to use the "cross-over" technique at a 45 degree angle towards the opposite eye as opposed to straight up the nasal passageway.   Sudafed (generic pseudoephedrine - Note this is the one that is available behind the pharmacy counter); Products with phenylephrine (-PE) may also be used but is often not as effective as pseudoephedrine.   If you have HIGH BLOOD PRESSURE - Coricidin HBP; AVOID any product that is -D as this contains pseudoephedrine which may increase your blood pressure.  Afrin (oxymetazoline) every 6-8 hours for up to 3 days.   Allergies - helps relieve runny nose, itchy eyes and sneezing   Claritin (generic loratidine), Allegra (fexofenidine), or Zyrtec (generic cyrterizine) for runny nose. These medications should not cause drowsiness.  Note - Benadryl (generic diphenhydramine) may be used however may cause drowsiness  Cough -   Delsym or Robitussin (generic dextromethorphan)  Expectorants - helps loosen mucus to ease removal   Mucinex (generic guaifenesin) as directed on the package.  Headaches / General Aches   Tylenol (generic acetaminophen) - DO NOT EXCEED 3 grams (3,000 mg) in a 24 hour time period  Advil/Motrin (generic ibuprofen)   Sore Throat -   Salt water gargle   Chloraseptic (generic benzocaine) spray or lozenges / Sucrets (generic dyclonine)    Sinusitis Sinusitis is  redness, soreness, and inflammation of the paranasal sinuses. Paranasal sinuses are air pockets within the bones of your face (beneath the eyes, the middle of the forehead, or above the eyes). In healthy paranasal sinuses, mucus is able to drain out, and air is able to circulate through them by way of your nose. However, when your paranasal sinuses are inflamed, mucus and air can become trapped. This can allow bacteria and other germs to grow and cause infection. Sinusitis can develop quickly and last only a short time (acute) or continue over a long period (chronic). Sinusitis that lasts for more than 12 weeks is considered chronic.  CAUSES  Causes of sinusitis include:  Allergies.  Structural abnormalities, such as displacement of the cartilage that separates your nostrils (deviated septum), which can decrease the air flow through your nose and sinuses and affect sinus drainage.  Functional abnormalities, such as when the small hairs (cilia) that line your sinuses and help remove mucus do not work properly or are not present. SIGNS AND SYMPTOMS  Symptoms of acute and chronic sinusitis are the same. The primary symptoms are pain and pressure around the affected sinuses. Other symptoms include:  Upper toothache.  Earache.  Headache.  Bad breath.  Decreased sense of smell and taste.  A cough, which worsens when you are lying flat.  Fatigue.  Fever.  Thick drainage from your nose, which often is green and may contain pus (purulent).  Swelling and warmth over the affected sinuses. DIAGNOSIS  Your health care provider will perform a physical exam. During the exam, your health  care provider may:  Look in your nose for signs of abnormal growths in your nostrils (nasal polyps).  Tap over the affected sinus to check for signs of infection.  View the inside of your sinuses (endoscopy) using an imaging device that has a light attached (endoscope). If your health care provider suspects  that you have chronic sinusitis, one or more of the following tests may be recommended:  Allergy tests.  Nasal culture. A sample of mucus is taken from your nose, sent to a lab, and screened for bacteria.  Nasal cytology. A sample of mucus is taken from your nose and examined by your health care provider to determine if your sinusitis is related to an allergy. TREATMENT  Most cases of acute sinusitis are related to a viral infection and will resolve on their own within 10 days. Sometimes medicines are prescribed to help relieve symptoms (pain medicine, decongestants, nasal steroid sprays, or saline sprays).  However, for sinusitis related to a bacterial infection, your health care provider will prescribe antibiotic medicines. These are medicines that will help kill the bacteria causing the infection.  Rarely, sinusitis is caused by a fungal infection. In theses cases, your health care provider will prescribe antifungal medicine. For some cases of chronic sinusitis, surgery is needed. Generally, these are cases in which sinusitis recurs more than 3 times per year, despite other treatments. HOME CARE INSTRUCTIONS   Drink plenty of water. Water helps thin the mucus so your sinuses can drain more easily.  Use a humidifier.  Inhale steam 3 to 4 times a day (for example, sit in the bathroom with the shower running).  Apply a warm, moist washcloth to your face 3 to 4 times a day, or as directed by your health care provider.  Use saline nasal sprays to help moisten and clean your sinuses.  Take medicines only as directed by your health care provider.  If you were prescribed either an antibiotic or antifungal medicine, finish it all even if you start to feel better. SEEK IMMEDIATE MEDICAL CARE IF:  You have increasing pain or severe headaches.  You have nausea, vomiting, or drowsiness.  You have swelling around your face.  You have vision problems.  You have a stiff neck.  You have  difficulty breathing. MAKE SURE YOU:   Understand these instructions.  Will watch your condition.  Will get help right away if you are not doing well or get worse. Document Released: 08/05/2005 Document Revised: 12/20/2013 Document Reviewed: 08/20/2011 [facility] Patient Information 2015 [place], [place]. This information is not intended to replace advice given to you by your health care provider. Make sure you discuss any questions you have with your health care provider.
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
## 2016-06-05 ENCOUNTER — Ambulatory Visit (INDEPENDENT_AMBULATORY_CARE_PROVIDER_SITE_OTHER): Payer: BLUE CROSS/BLUE SHIELD | Admitting: Family Medicine

## 2016-06-05 DIAGNOSIS — Z23 Encounter for immunization: Secondary | ICD-10-CM | POA: Diagnosis not present

## 2016-10-16 IMAGING — US US ABDOMEN LIMITED
1 series · 14 of 25 positions shown · non-contrast
Comparison: 11/03/2014

CLINICAL DATA: Right upper quadrant pain, nausea. Follow-up
hemangioma.

EXAM:
US ABDOMEN LIMITED - RIGHT UPPER QUADRANT

[Series 1: us abdomen limited · 0.35mm/px · 14 of 43 slices shown]
[im 1/43]
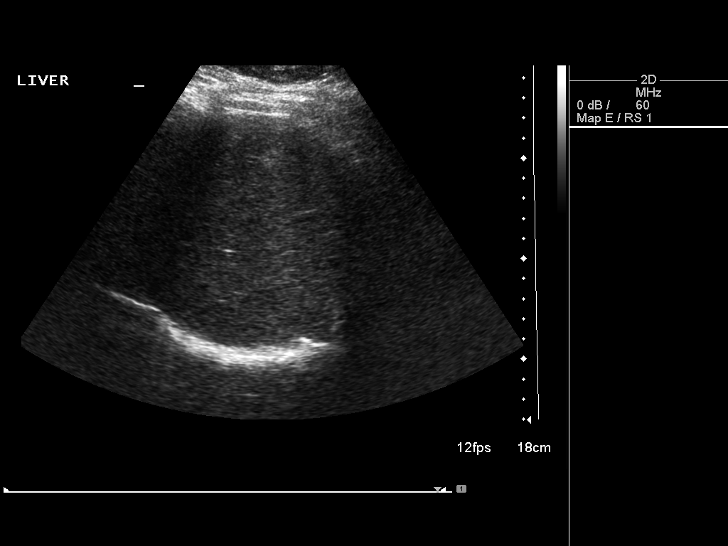
[im 4/43]
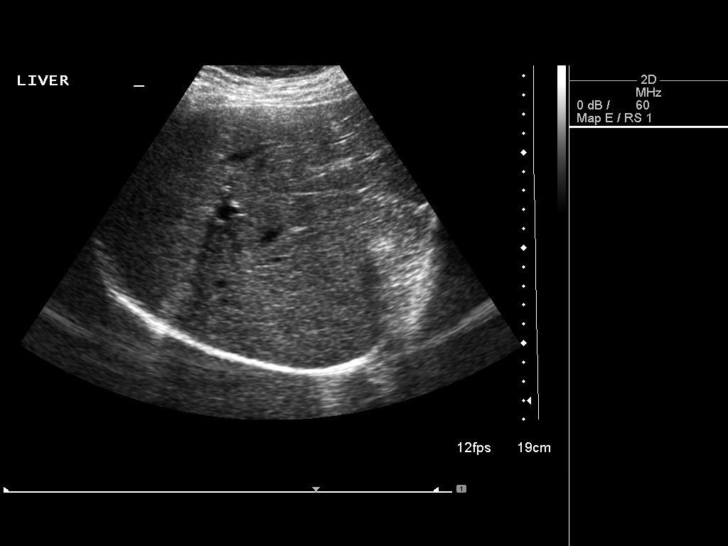
[im 8/43]
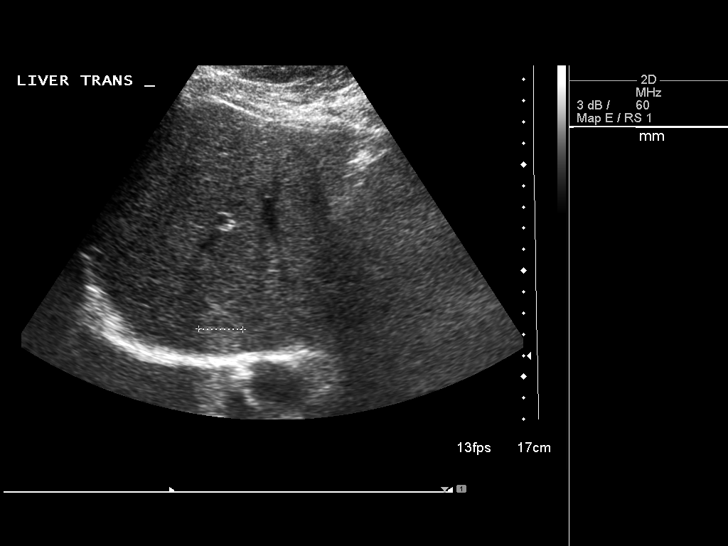
[im 11/43]
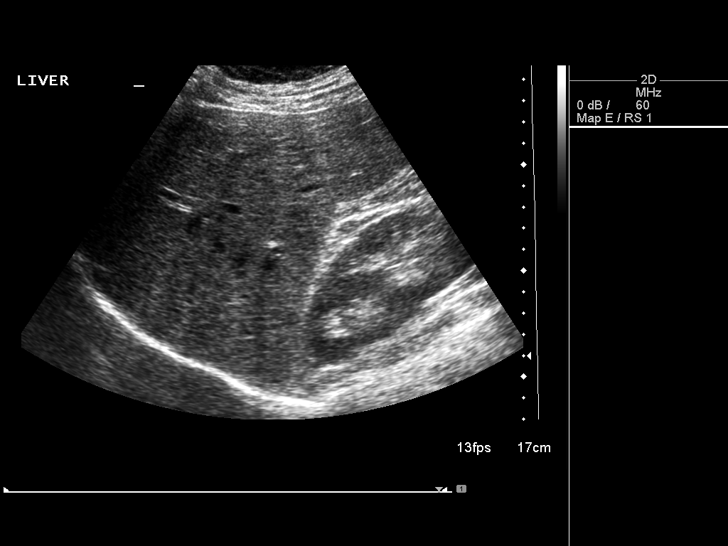
[im 15/43]
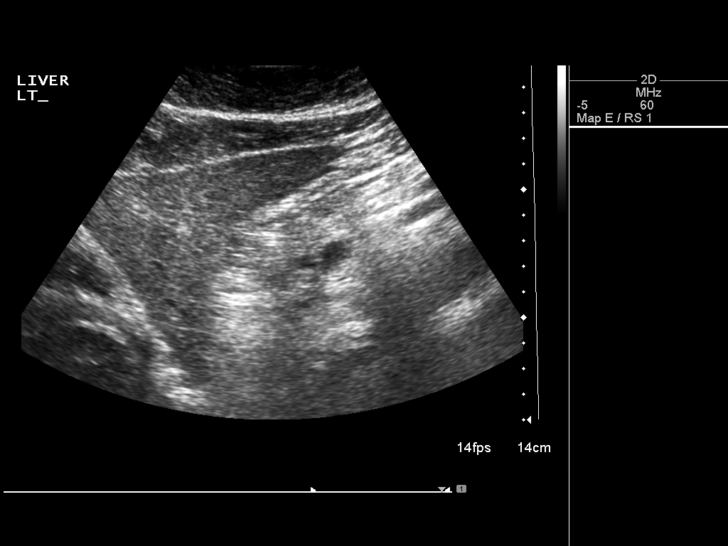
[im 16/43]
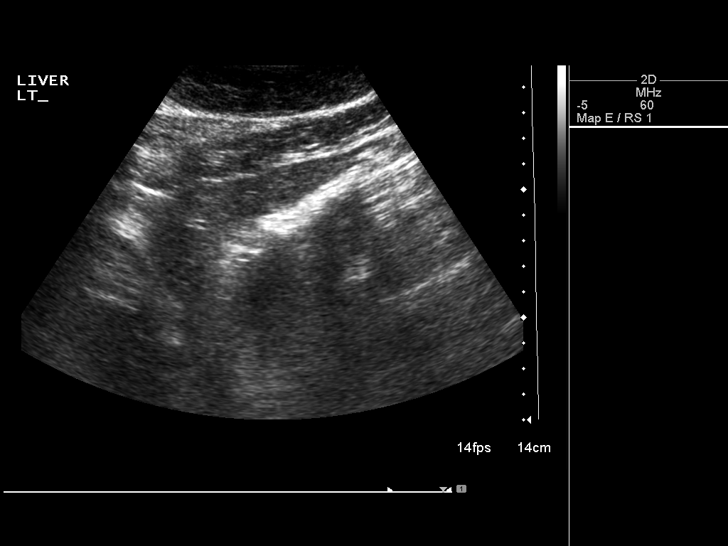
[im 20/43]
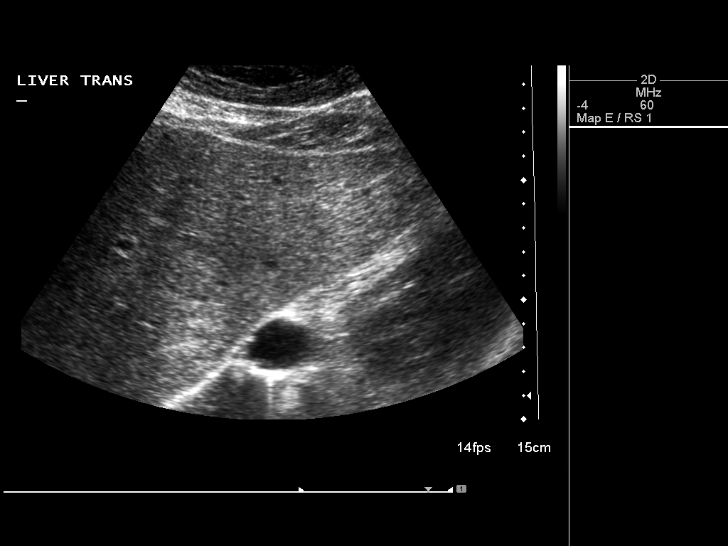
[im 23/43]
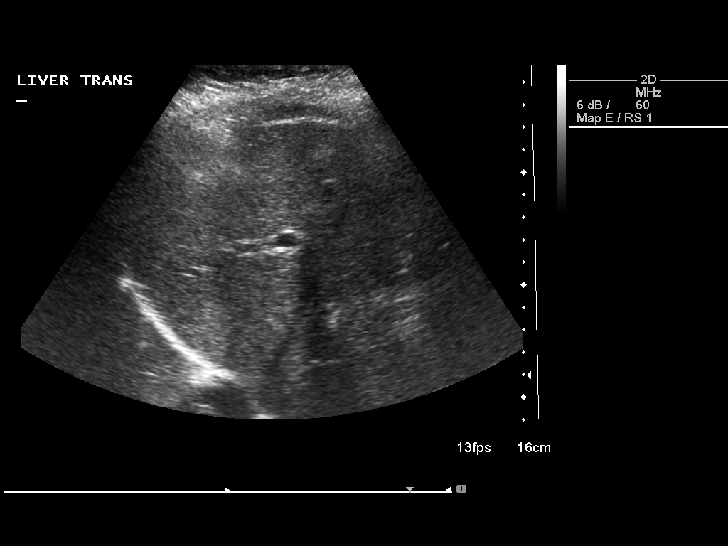
[im 27/43]
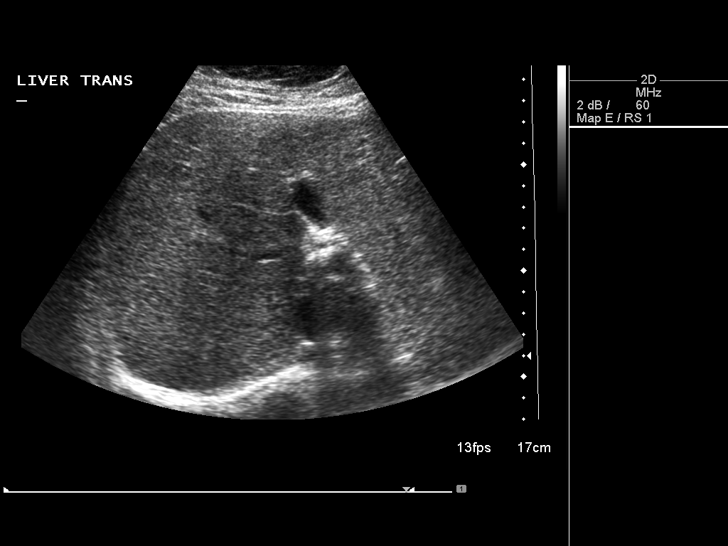
[im 29/43]
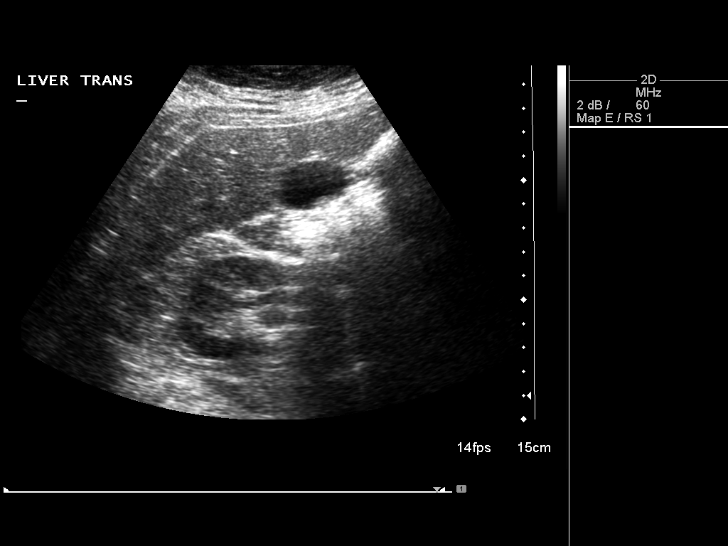
[im 32/43]
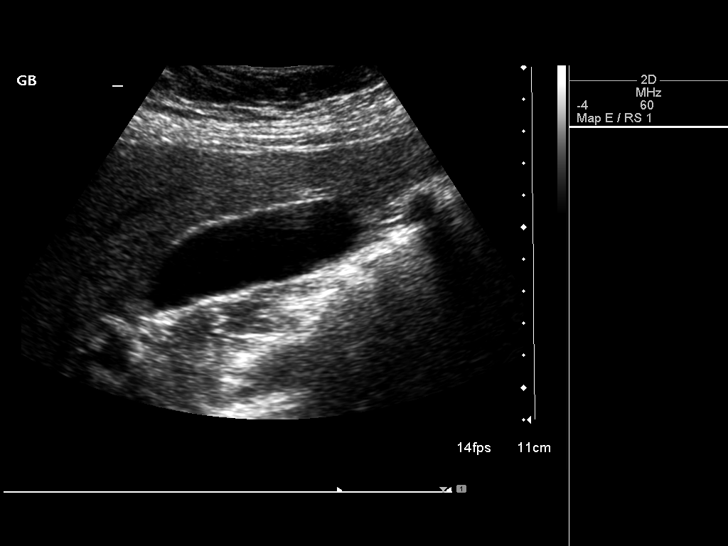
[im 36/43]
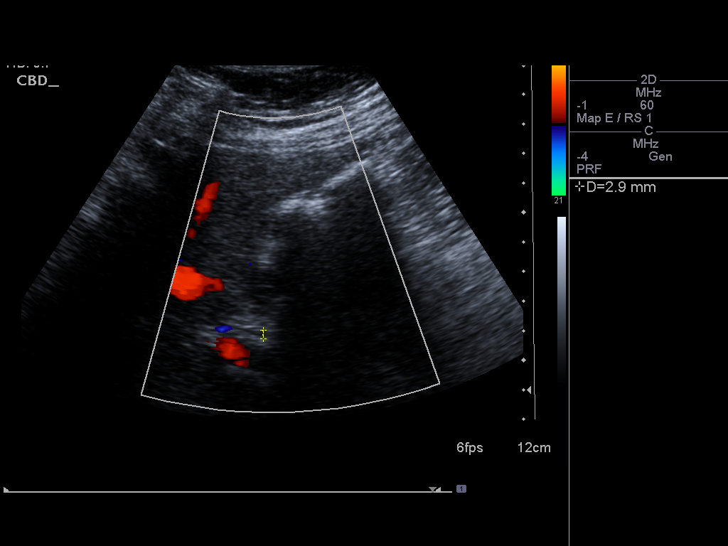
[im 39/43]
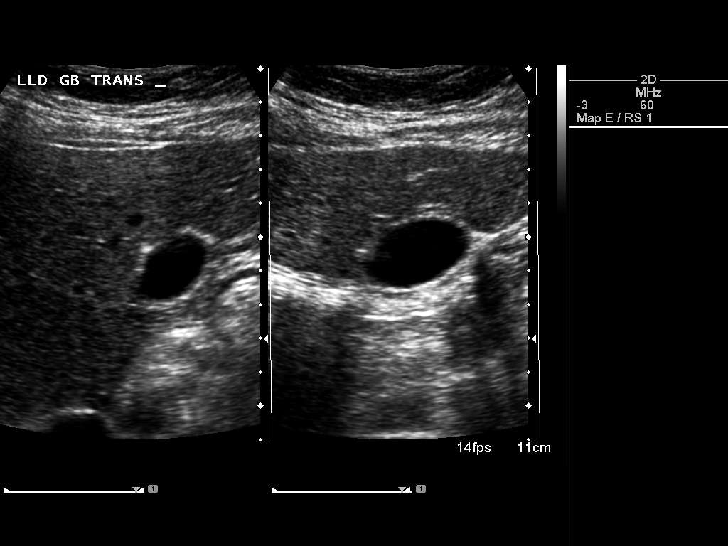
[im 43/43]
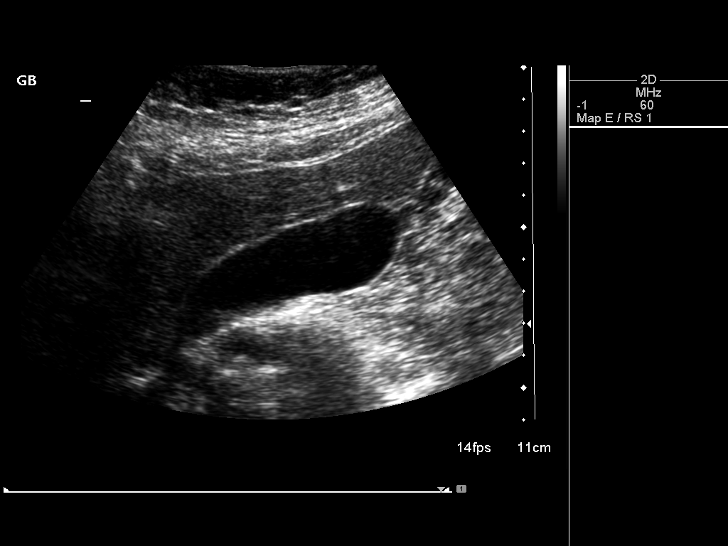

[14 of 25 positions shown; findings below may reference images not displayed]

FINDINGS: Gallbladder:

No gallstones or wall thickening visualized. No sonographic [facility]
sign noted by sonographer.

Common bile duct:

Diameter: Normal caliber, 4 mm.

Liver:

2.4 cm hyperechoic area within the posterior right hepatic lobe is
again noted, most compatible with hemangioma. No significant change
since prior study.
IMPRESSION: Stable hyperechoic lesion in the posterior right hepatic lobe,
likely hemangioma. This could be followed with repeat ultrasound in
1 year to ensure 2 year stability.

No acute findings.

## 2017-01-02 ENCOUNTER — Ambulatory Visit (INDEPENDENT_AMBULATORY_CARE_PROVIDER_SITE_OTHER): Payer: BLUE CROSS/BLUE SHIELD | Admitting: Family Medicine

## 2017-01-02 VITALS — BP 124/84 | Temp 98.5°F | Ht 66.0 in | Wt 285.0 lb

## 2017-01-02 DIAGNOSIS — W57XXXA Bitten or stung by nonvenomous insect and other nonvenomous arthropods, initial encounter: Secondary | ICD-10-CM

## 2017-01-02 DIAGNOSIS — L03811 Cellulitis of head [any part, except face]: Secondary | ICD-10-CM | POA: Diagnosis not present

## 2017-01-02 MED ORDER — FLUCONAZOLE 150 MG PO TABS
150.0000 mg | ORAL_TABLET | Freq: Once | ORAL | 5 refills | Status: AC
Start: 2017-01-02 — End: 2017-01-02

## 2017-01-02 MED ORDER — DOXYCYCLINE HYCLATE 100 MG PO CAPS: 100.0000 mg | ORAL_CAPSULE | Freq: Two times a day (BID) | ORAL | 0 refills | Status: AC

## 2017-01-02 NOTE — Patient Instructions (Signed)
WE NOW OFFER   [facility] FAST TRACK!!!  SAME DAY Appointments for ACUTE CARE  Such as: Sprains, Injuries, cuts, abrasions, rashes, muscle pain, joint pain, back pain Colds, flu, sore throats, headache, allergies, cough, fever  Ear pain, sinus and eye infections Abdominal pain, nausea, vomiting, diarrhea, upset stomach Animal/insect bites  3 Easy Ways to Schedule: Walk-In Scheduling Call in scheduling Mychart Sign-up: [email or web address]         

## 2017-01-03 ENCOUNTER — Encounter: Payer: Self-pay | Admitting: Family Medicine

## 2017-01-03 ENCOUNTER — Ambulatory Visit: Payer: BLUE CROSS/BLUE SHIELD | Admitting: Family Medicine

## 2017-01-03 NOTE — Progress Notes (Signed)
   Subjective:    Patient ID: Pamela Lopez, female    DOB: 08-14-1976, 41 y.o.   MRN: [id number]  HPI Here for 2 days of a tender knot behind the right ear and a sore spot on the right side of the head. No fever.   Review of Systems  Constitutional: Negative.   HENT: Negative.   Eyes: Negative.   Respiratory: Negative.   Skin: Positive for rash.  Hematological: Positive for adenopathy.       Objective:   Physical Exam  Constitutional: She appears well-developed and well-nourished.  HENT:  Right Ear: External ear normal.  Left Ear: External ear normal.  Nose: Nose normal.  Mouth/Throat: Oropharynx is clear and moist.  The right parietal scalp has a small macular red area which looks like an insect bite. There is a single enlarged tender lymph node behind the right ear lobe   Eyes: Conjunctivae are normal.          Assessment & Plan:  She has a postauricular reactive lymph node and what is apparently an insect bite with a zone of inflammation around it. Treat with Doxycycline for 10 days. Use Advil prn. Recheck prn. Alysia Penna, MD

## 2017-05-08 ENCOUNTER — Encounter: Payer: Self-pay | Admitting: Family Medicine

## 2017-05-08 ENCOUNTER — Ambulatory Visit (INDEPENDENT_AMBULATORY_CARE_PROVIDER_SITE_OTHER): Payer: BLUE CROSS/BLUE SHIELD | Admitting: Family Medicine

## 2017-05-08 VITALS — BP 123/88 | HR 82 | Temp 98.5°F | Ht 66.0 in | Wt 279.0 lb

## 2017-05-08 DIAGNOSIS — J018 Other acute sinusitis: Secondary | ICD-10-CM

## 2017-05-08 MED ORDER — FLUCONAZOLE 150 MG PO TABS: 150.0000 mg | ORAL_TABLET | Freq: Once | ORAL | 5 refills | Status: AC

## 2017-05-08 MED ORDER — AZITHROMYCIN 250 MG PO TABS: ORAL_TABLET | ORAL | 0 refills | Status: DC

## 2017-05-08 MED ORDER — METHYLPREDNISOLONE ACETATE 80 MG/ML IJ SUSP
120.0000 mg | Freq: Once | INTRAMUSCULAR | Status: AC
Start: 2017-05-08 — End: 2017-05-08
  Administered 2017-05-08: 120 mg via INTRAMUSCULAR

## 2017-05-08 NOTE — Progress Notes (Signed)
   Subjective:    Patient ID: Pamela Lopez, female    DOB: 04-22-1976, 41 y.o.   MRN: [id number]  HPI Here for 5 days of sinus pressure, headache, PND, and ST. No cough. On Mucinex.    Review of Systems  Constitutional: Negative.   HENT: Positive for congestion, postnasal drip, sinus pain, sinus pressure and sore throat.   Eyes: Negative.   Respiratory: Negative.        Objective:   Physical Exam  Constitutional: She appears well-developed and well-nourished.  HENT:  Right Ear: External ear normal.  Left Ear: External ear normal.  Nose: Nose normal.  Mouth/Throat: Oropharynx is clear and moist.  Eyes: Conjunctivae are normal.  Neck: No thyromegaly present.  Pulmonary/Chest: Effort normal and breath sounds normal. No respiratory distress. She has no wheezes. She has no rales.  Lymphadenopathy:    She has no cervical adenopathy.          Assessment & Plan:  Sinusitis, treat with a steroid shot and a Zpack.  Alysia Penna, MD

## 2017-05-08 NOTE — Patient Instructions (Signed)
WE NOW OFFER   [facility] FAST TRACK!!!  SAME DAY Appointments for ACUTE CARE  Such as: Sprains, Injuries, cuts, abrasions, rashes, muscle pain, joint pain, back pain Colds, flu, sore throats, headache, allergies, cough, fever  Ear pain, sinus and eye infections Abdominal pain, nausea, vomiting, diarrhea, upset stomach Animal/insect bites  3 Easy Ways to Schedule: Walk-In Scheduling Call in scheduling Mychart Sign-up: [email or web address]         

## 2017-05-08 NOTE — Addendum Note (Signed)
Addended by: Aggie Hacker A on: 05/08/2017 09:33 AM   Modules accepted: Orders

## 2017-05-27 ENCOUNTER — Ambulatory Visit (INDEPENDENT_AMBULATORY_CARE_PROVIDER_SITE_OTHER): Payer: BLUE CROSS/BLUE SHIELD | Admitting: *Deleted

## 2017-05-27 DIAGNOSIS — Z23 Encounter for immunization: Secondary | ICD-10-CM | POA: Diagnosis not present

## 2017-06-04 ENCOUNTER — Encounter: Payer: Self-pay | Admitting: Family Medicine

## 2017-06-04 ENCOUNTER — Ambulatory Visit (INDEPENDENT_AMBULATORY_CARE_PROVIDER_SITE_OTHER): Payer: BLUE CROSS/BLUE SHIELD | Admitting: Family Medicine

## 2017-06-04 VITALS — BP 106/80 | Temp 98.3°F | Ht 66.0 in | Wt 275.0 lb

## 2017-06-04 DIAGNOSIS — Z0000 Encounter for general adult medical examination without abnormal findings: Secondary | ICD-10-CM | POA: Diagnosis not present

## 2017-06-04 LAB — CBC WITH DIFFERENTIAL/PLATELET
Basophils Absolute: 0 10*3/uL (ref 0.0–0.1)
Basophils Relative: 0.4 % (ref 0.0–3.0)
Eosinophils Absolute: 0 10*3/uL (ref 0.0–0.7)
Eosinophils Relative: 0.9 % (ref 0.0–5.0)
HCT: 39.1 % (ref 36.0–46.0)
Hemoglobin: 12.9 g/dL (ref 12.0–15.0)
Lymphocytes Relative: 33.7 % (ref 12.0–46.0)
Lymphs Abs: 1.7 10*3/uL (ref 0.7–4.0)
MCHC: 32.9 g/dL (ref 30.0–36.0)
MCV: 85.7 fl (ref 78.0–100.0)
Monocytes Absolute: 0.4 10*3/uL (ref 0.1–1.0)
Monocytes Relative: 8.1 % (ref 3.0–12.0)
Neutro Abs: 2.8 10*3/uL (ref 1.4–7.7)
Neutrophils Relative %: 56.9 % (ref 43.0–77.0)
Platelets: 221 10*3/uL (ref 150.0–400.0)
RBC: 4.56 Mil/uL (ref 3.87–5.11)
RDW: 14.1 % (ref 11.5–15.5)
WBC: 5 10*3/uL (ref 4.0–10.5)

## 2017-06-04 LAB — BASIC METABOLIC PANEL
BUN: 16 mg/dL (ref 6–23)
CHLORIDE: 104 meq/L (ref 96–112)
CO2: 26 mEq/L (ref 19–32)
Calcium: 9.4 mg/dL (ref 8.4–10.5)
Creatinine, Ser: 0.85 mg/dL (ref 0.40–1.20)
GFR: 95.2 mL/min (ref >60.00)
GLUCOSE: 95 mg/dL (ref 70–99)
POTASSIUM: 4.2 meq/L (ref 3.5–5.1)
SODIUM: 138 meq/L (ref 135–145)

## 2017-06-04 LAB — HEPATIC FUNCTION PANEL
ALK PHOS: 46 U/L (ref 39–117)
ALT: 9 U/L (ref 0–35)
AST: 11 U/L (ref 0–37)
Albumin: 3.9 g/dL (ref 3.5–5.2)
BILIRUBIN DIRECT: 0.1 mg/dL (ref 0.0–0.3)
BILIRUBIN TOTAL: 0.4 mg/dL (ref 0.2–1.2)
Total Protein: 7 g/dL (ref 6.0–8.3)

## 2017-06-04 LAB — LIPID PANEL
CHOL/HDL RATIO: 3
Cholesterol: 147 mg/dL (ref 0–200)
HDL: 51.2 mg/dL (ref >39.00)
LDL Cholesterol: 77 mg/dL (ref 0–99)
NONHDL: 95.49
Triglycerides: 92 mg/dL (ref 0.0–149.0)
VLDL: 18.4 mg/dL (ref 0.0–40.0)

## 2017-06-04 LAB — POC URINALSYSI DIPSTICK (AUTOMATED)
BILIRUBIN UA: NEGATIVE
Blood, UA: NEGATIVE
GLUCOSE UA: NEGATIVE
Ketones, UA: NEGATIVE
LEUKOCYTES UA: NEGATIVE
NITRITE UA: NEGATIVE
Protein, UA: NEGATIVE
Spec Grav, UA: 1.025 (ref 1.010–1.025)
UROBILINOGEN UA: 0.2 U/dL
pH, UA: 6 (ref 5.0–8.0)

## 2017-06-04 LAB — TSH: TSH: 1.77 u[IU]/mL (ref 0.35–4.50)

## 2017-06-04 MED ORDER — PROMETHAZINE HCL 25 MG PO TABS: 25.0000 mg | ORAL_TABLET | ORAL | 3 refills | Status: DC | PRN

## 2017-06-04 MED ORDER — ALBUTEROL SULFATE HFA 108 (90 BASE) MCG/ACT IN AERS: 2.0000 | INHALATION_SPRAY | RESPIRATORY_TRACT | 3 refills | Status: DC | PRN

## 2017-06-04 NOTE — Patient Instructions (Signed)
WE NOW OFFER   [facility] FAST TRACK!!!  SAME DAY Appointments for ACUTE CARE  Such as: Sprains, Injuries, cuts, abrasions, rashes, muscle pain, joint pain, back pain Colds, flu, sore throats, headache, allergies, cough, fever  Ear pain, sinus and eye infections Abdominal pain, nausea, vomiting, diarrhea, upset stomach Animal/insect bites  3 Easy Ways to Schedule: Walk-In Scheduling Call in scheduling Mychart Sign-up: [email or web address]         

## 2017-06-04 NOTE — Progress Notes (Signed)
   Subjective:    Patient ID: Pamela Lopez, female    DOB: 21-Dec-1975, 41 y.o.   MRN: [id number]  HPI Here for a well exam. Se feels well in general. She has a torn meniscus in the right knee, and she sees the [facility] for this. She will have surgery after the first of the year most likely.    Review of Systems  Constitutional: Negative.   HENT: Negative.   Eyes: Negative.   Respiratory: Negative.   Cardiovascular: Negative.   Gastrointestinal: Negative.   Genitourinary: Negative for decreased urine volume, difficulty urinating, dyspareunia, dysuria, enuresis, flank pain, frequency, hematuria, pelvic pain and urgency.  Musculoskeletal: Positive for arthralgias. Negative for back pain, gait problem, joint swelling, myalgias, neck pain and neck stiffness.  Skin: Negative.   Neurological: Negative.   Psychiatric/Behavioral: Negative.        Objective:   Physical Exam  Constitutional: She is oriented to person, place, and time. She appears well-developed and well-nourished. No distress.  HENT:  Head: Normocephalic and atraumatic.  Right Ear: External ear normal.  Left Ear: External ear normal.  Nose: Nose normal.  Mouth/Throat: Oropharynx is clear and moist. No oropharyngeal exudate.  Eyes: Pupils are equal, round, and reactive to light. Conjunctivae and EOM are normal. No scleral icterus.  Neck: Normal range of motion. Neck supple. No JVD present. No thyromegaly present.  Cardiovascular: Normal rate, regular rhythm, normal heart sounds and intact distal pulses.  Exam reveals no gallop and no friction rub.   No murmur heard. Pulmonary/Chest: Effort normal and breath sounds normal. No respiratory distress. She has no wheezes. She has no rales. She exhibits no tenderness.  Abdominal: Soft. Bowel sounds are normal. She exhibits no distension and no mass. There is no tenderness. There is no rebound and no guarding.  Musculoskeletal: Normal range of motion. She exhibits no  edema or tenderness.  Lymphadenopathy:    She has no cervical adenopathy.  Neurological: She is alert and oriented to person, place, and time. She has normal reflexes. No cranial nerve deficit. She exhibits normal muscle tone. Coordination normal.  Skin: Skin is warm and dry. No rash noted. No erythema.  Psychiatric: She has a normal mood and affect. Her behavior is normal. Judgment and thought content normal.          Assessment & Plan:  Well exam. We discussed diet and exercise. Get fasting labs.  Alysia Penna, MD

## 2017-12-12 ENCOUNTER — Other Ambulatory Visit: Payer: Self-pay | Admitting: Family Medicine

## 2018-05-28 ENCOUNTER — Encounter (INDEPENDENT_AMBULATORY_CARE_PROVIDER_SITE_OTHER): Payer: Self-pay

## 2018-06-09 ENCOUNTER — Ambulatory Visit (INDEPENDENT_AMBULATORY_CARE_PROVIDER_SITE_OTHER): Payer: BLUE CROSS/BLUE SHIELD | Admitting: Family Medicine

## 2018-06-09 ENCOUNTER — Encounter: Payer: Self-pay | Admitting: Family Medicine

## 2018-06-09 VITALS — BP 126/88 | HR 88 | Temp 98.2°F | Ht 67.5 in | Wt 269.0 lb

## 2018-06-09 DIAGNOSIS — Z23 Encounter for immunization: Secondary | ICD-10-CM | POA: Diagnosis not present

## 2018-06-09 DIAGNOSIS — Z0000 Encounter for general adult medical examination without abnormal findings: Secondary | ICD-10-CM | POA: Diagnosis not present

## 2018-06-09 LAB — CBC WITH DIFFERENTIAL/PLATELET
Basophils Absolute: 0 10*3/uL (ref 0.0–0.1)
Basophils Relative: 0.6 % (ref 0.0–3.0)
EOS PCT: 0.6 % (ref 0.0–5.0)
Eosinophils Absolute: 0 10*3/uL (ref 0.0–0.7)
HCT: 39.9 % (ref 36.0–46.0)
Hemoglobin: 13.4 g/dL (ref 12.0–15.0)
LYMPHS ABS: 1.4 10*3/uL (ref 0.7–4.0)
LYMPHS PCT: 28.9 % (ref 12.0–46.0)
MCHC: 33.5 g/dL (ref 30.0–36.0)
MCV: 84.7 fl (ref 78.0–100.0)
MONOS PCT: 8 % (ref 3.0–12.0)
Monocytes Absolute: 0.4 10*3/uL (ref 0.1–1.0)
NEUTROS ABS: 3 10*3/uL (ref 1.4–7.7)
NEUTROS PCT: 61.9 % (ref 43.0–77.0)
PLATELETS: 247 10*3/uL (ref 150.0–400.0)
RBC: 4.71 Mil/uL (ref 3.87–5.11)
RDW: 13.7 % (ref 11.5–15.5)
WBC: 4.8 10*3/uL (ref 4.0–10.5)

## 2018-06-09 LAB — POC URINALSYSI DIPSTICK (AUTOMATED)
Bilirubin, UA: NEGATIVE
GLUCOSE UA: NEGATIVE
Ketones, UA: NEGATIVE
LEUKOCYTES UA: NEGATIVE
Nitrite, UA: NEGATIVE
Protein, UA: POSITIVE — AB
Spec Grav, UA: 1.015 (ref 1.010–1.025)
UROBILINOGEN UA: 0.2 U/dL
pH, UA: 6.5 (ref 5.0–8.0)

## 2018-06-09 LAB — HEPATIC FUNCTION PANEL
ALK PHOS: 43 U/L (ref 39–117)
ALT: 12 U/L (ref 0–35)
AST: 11 U/L (ref 0–37)
Albumin: 4.3 g/dL (ref 3.5–5.2)
BILIRUBIN DIRECT: 0.1 mg/dL (ref 0.0–0.3)
BILIRUBIN TOTAL: 0.5 mg/dL (ref 0.2–1.2)
Total Protein: 7.4 g/dL (ref 6.0–8.3)

## 2018-06-09 LAB — BASIC METABOLIC PANEL
BUN: 14 mg/dL (ref 6–23)
CALCIUM: 9.4 mg/dL (ref 8.4–10.5)
CHLORIDE: 105 meq/L (ref 96–112)
CO2: 27 mEq/L (ref 19–32)
Creatinine, Ser: 0.98 mg/dL (ref 0.40–1.20)
GFR: 80.38 mL/min (ref >60.00)
Glucose, Bld: 96 mg/dL (ref 70–99)
Potassium: 3.8 mEq/L (ref 3.5–5.1)
Sodium: 140 mEq/L (ref 135–145)

## 2018-06-09 LAB — LIPID PANEL
CHOL/HDL RATIO: 3
CHOLESTEROL: 142 mg/dL (ref 0–200)
HDL: 51.3 mg/dL (ref >39.00)
LDL CALC: 74 mg/dL (ref 0–99)
NonHDL: 91.09
TRIGLYCERIDES: 85 mg/dL (ref 0.0–149.0)
VLDL: 17 mg/dL (ref 0.0–40.0)

## 2018-06-09 LAB — TSH: TSH: 1.23 u[IU]/mL (ref 0.35–4.50)

## 2018-06-09 MED ORDER — ALBUTEROL SULFATE HFA 108 (90 BASE) MCG/ACT IN AERS: 2.0000 | INHALATION_SPRAY | RESPIRATORY_TRACT | 3 refills | Status: DC | PRN

## 2018-06-09 MED ORDER — PROMETHAZINE HCL 25 MG PO TABS: ORAL_TABLET | ORAL | 3 refills | Status: DC

## 2018-06-09 NOTE — Progress Notes (Signed)
   Subjective:    Patient ID: Pamela Lopez, female    DOB: 06/10/1976, 42 y.o.   MRN: [id number]  HPI Here for a well exam. She feels fine.    Review of Systems  Constitutional: Negative.   HENT: Negative.   Eyes: Negative.   Respiratory: Negative.   Cardiovascular: Negative.   Gastrointestinal: Negative.   Genitourinary: Negative for decreased urine volume, difficulty urinating, dyspareunia, dysuria, enuresis, flank pain, frequency, hematuria, pelvic pain and urgency.  Musculoskeletal: Negative.   Skin: Negative.   Neurological: Negative.   Psychiatric/Behavioral: Negative.        Objective:   Physical Exam  Constitutional: She is oriented to person, place, and time. She appears well-developed and well-nourished. No distress.  HENT:  Head: Normocephalic and atraumatic.  Right Ear: External ear normal.  Left Ear: External ear normal.  Nose: Nose normal.  Mouth/Throat: Oropharynx is clear and moist. No oropharyngeal exudate.  Eyes: Pupils are equal, round, and reactive to light. Conjunctivae and EOM are normal. No scleral icterus.  Neck: Normal range of motion. Neck supple. No JVD present. No thyromegaly present.  Cardiovascular: Normal rate, regular rhythm, normal heart sounds and intact distal pulses. Exam reveals no gallop and no friction rub.  No murmur heard. Pulmonary/Chest: Effort normal and breath sounds normal. No respiratory distress. She has no wheezes. She has no rales. She exhibits no tenderness.  Abdominal: Soft. Bowel sounds are normal. She exhibits no distension and no mass. There is no tenderness. There is no rebound and no guarding.  Musculoskeletal: Normal range of motion. She exhibits no edema or tenderness.  Lymphadenopathy:    She has no cervical adenopathy.  Neurological: She is alert and oriented to person, place, and time. She has normal reflexes. She displays normal reflexes. No cranial nerve deficit. She exhibits normal muscle tone. Coordination  normal.  Skin: Skin is warm and dry. No rash noted. No erythema.  Psychiatric: She has a normal mood and affect. Her behavior is normal. Judgment and thought content normal.          Assessment & Plan:  Well exam. We discussed diet and exercise. Get fasting labs.  Alysia Penna, MD

## 2018-06-10 ENCOUNTER — Encounter: Payer: Self-pay | Admitting: *Deleted

## 2018-06-18 NOTE — Telephone Encounter (Signed)
Called and spoke with pt and she is aware of her lab results

## 2018-07-14 ENCOUNTER — Encounter: Payer: Self-pay | Admitting: Family Medicine

## 2018-07-14 ENCOUNTER — Ambulatory Visit: Payer: BLUE CROSS/BLUE SHIELD | Admitting: Family Medicine

## 2018-07-14 VITALS — BP 126/84 | HR 86 | Temp 98.5°F | Wt 260.2 lb

## 2018-07-14 DIAGNOSIS — J019 Acute sinusitis, unspecified: Secondary | ICD-10-CM | POA: Diagnosis not present

## 2018-07-14 MED ORDER — AZITHROMYCIN 250 MG PO TABS: ORAL_TABLET | ORAL | 0 refills | Status: DC

## 2018-07-14 MED ORDER — HYDROCODONE-HOMATROPINE 5-1.5 MG/5ML PO SYRP: 5.0000 mL | ORAL_SOLUTION | ORAL | 0 refills | Status: DC | PRN

## 2018-07-14 NOTE — Progress Notes (Signed)
   Subjective:    Patient ID: Pamela Lopez, female    DOB: [birth year], 42 y.o.   MRN: [id number]  HPI Here for one week of sinus pressure, PND, and a dry cough. No fever or body aches. Using Alka Seltzer Cold and Delsym.    Review of Systems  Constitutional: Negative.   HENT: Positive for congestion, postnasal drip, sinus pressure and sore throat. Negative for sinus pain.   Eyes: Negative.   Respiratory: Positive for cough.        Objective:   Physical Exam  Constitutional: She appears well-developed and well-nourished.  HENT:  Right Ear: External ear normal.  Left Ear: External ear normal.  Nose: Nose normal.  Mouth/Throat: Oropharynx is clear and moist.  Eyes: Conjunctivae are normal.  Neck: No thyromegaly present.  Pulmonary/Chest: Effort normal and breath sounds normal. No stridor. No respiratory distress. She has no wheezes. She has no rales.  Lymphadenopathy:    She has no cervical adenopathy.          Assessment & Plan:  Sinusitis, treat with a Zpack.  Alysia Penna, MD

## 2018-07-22 ENCOUNTER — Ambulatory Visit: Payer: Self-pay | Admitting: *Deleted

## 2018-07-22 MED ORDER — AZITHROMYCIN 250 MG PO TABS: ORAL_TABLET | ORAL | 0 refills | Status: DC

## 2018-07-22 NOTE — Addendum Note (Signed)
Addended by: Elie Confer on: 07/22/2018 05:32 PM   Modules accepted: Orders

## 2018-07-22 NOTE — Telephone Encounter (Signed)
Called and spoke with pt and she is aware of zpak that has been sent to the pharmacy.  

## 2018-07-22 NOTE — Telephone Encounter (Signed)
Call in another Zpack  

## 2018-07-22 NOTE — Telephone Encounter (Signed)
Pt wanted to know if another zpack can be called in for her. She saw Dr. Sarajane Jews on 07/14/18. She still has the pressure in her head. She has a dry cough. She takes Delsym and the cough med that was prescribed for her to take. Denies fever. She works for [employer] and can not get away for an appointment. And she is encountering co-workers who are sick and not going to the doctor.  She is drinking plenty of fluids, using hand sanitizer and washing her hands. She is requesting Dr. Sarajane Jews to give her a call back. Routing to flow at [facility].

## 2018-07-22 NOTE — Telephone Encounter (Signed)
Dr. Fry please advise. Thanks  

## 2018-09-10 ENCOUNTER — Other Ambulatory Visit: Payer: Self-pay | Admitting: Gastroenterology

## 2018-09-10 DIAGNOSIS — R1011 Right upper quadrant pain: Secondary | ICD-10-CM

## 2018-09-15 ENCOUNTER — Ambulatory Visit
Admission: RE | Admit: 2018-09-15 | Discharge: 2018-09-15 | Disposition: A | Discharge status: Home / Self Care | Payer: BLUE CROSS/BLUE SHIELD | Source: Ambulatory Visit | Attending: Gastroenterology | Admitting: Gastroenterology

## 2018-09-28 ENCOUNTER — Other Ambulatory Visit: Payer: Self-pay | Admitting: Surgery

## 2019-03-05 ENCOUNTER — Other Ambulatory Visit: Payer: Self-pay | Admitting: Surgery

## 2019-03-25 NOTE — Patient Instructions (Addendum)
YOU NEED TO HAVE A COVID 19 TEST ON 03-27-2019 AT 955 AM, THIS TEST MUST BE DONE BEFORE SURGERY, COME  [address] [place], [place] , [address]. ONCE YOUR COVID TEST IS COMPLETED, PLEASE BEGIN THE QUARANTINE INSTRUCTIONS AS OUTLINED IN YOUR HANDOUT.                Pamela Lopez    Your procedure is scheduled on: 03-31-2019  Report to [facility] Main  Entrance  Report to  [place] at 630 AM   1 VISITOR IS ALLOWED TO WAIT IN WAITING ROOM  ONLY DAY OF YOUR SURGERY.    Call this number if you have problems the morning of surgery [phone]    Remember: Do not eat food or drink liquids :After Midnight. BRUSH YOUR TEETH MORNING OF SURGERY AND RINSE YOUR MOUTH OUT, NO CHEWING GUM CANDY OR MINTS.     Take these medicines the morning of surgery with A SIP OF WATER:                                You may not have any metal on your body including hair pins and              piercings  Do not wear jewelry, make-up, lotions, powders or perfumes, deodorant             Do not wear nail polish.  Do not shave  48 hours prior to surgery.              Do not bring valuables to the hospital. [place].  Contacts, dentures or bridgework may not be worn into surgery.  Leave suitcase in the car. After surgery it may be brought to your room.     Patients discharged the day of surgery will not be allowed to drive home. IF YOU ARE HAVING SURGERY AND GOING HOME THE SAME DAY, YOU MUST HAVE AN ADULT TO DRIVE YOU HOME AND BE WITH YOU FOR 24 HOURS. YOU MAY GO HOME BY TAXI OR UBER OR ORTHERWISE, BUT AN ADULT MUST ACCOMPANY YOU HOME AND STAY WITH YOU FOR 24 HOURS.  Name and phone number of your driver:  Special Instructions: N/A              Please read over the following fact sheets you were given: _____________________________________________________________________             [facility] - Preparing for Surgery Before surgery, you can  play an important role.  Because skin is not sterile, your skin needs to be as free of germs as possible.  You can reduce the number of germs on your skin by washing with CHG (chlorahexidine gluconate) soap before surgery.  CHG is an antiseptic cleaner which kills germs and bonds with the skin to continue killing germs even after washing. Please DO NOT use if you have an allergy to CHG or antibacterial soaps.  If your skin becomes reddened/irritated stop using the CHG and inform your nurse when you arrive at Short Stay. Do not shave (including legs and underarms) for at least 48 hours prior to the first CHG shower.  You may shave your face/neck. Please follow these instructions carefully:  1.  Shower with CHG Soap the night before surgery and the  morning of  Surgery.  2.  If you choose to wash your hair, wash your hair first as usual with your  normal  shampoo.  3.  After you shampoo, rinse your hair and body thoroughly to remove the  shampoo.                                        4.  Use CHG as you would any other liquid soap.  You can apply chg directly  to the skin and wash                       Gently with a scrungie or clean washcloth.  5.  Apply the CHG Soap to your body ONLY FROM THE NECK DOWN.   Do not use on face/ open                           Wound or open sores. Avoid contact with eyes, ears mouth and genitals (private parts).                       Wash face,  Genitals (private parts) with your normal soap.             6.  Wash thoroughly, paying special attention to the area where your surgery  will be performed.  7.  Thoroughly rinse your body with warm water from the neck down.  8.  DO NOT shower/wash with your normal soap after using and rinsing off  the CHG Soap.                9.  Pat yourself dry with a clean towel.            10.  Wear clean pajamas.            11.  Place clean sheets on your bed the night of your first shower and do not  sleep with pets. Day of Surgery : Do  not apply any lotions/deodorants the morning of surgery.  Please wear clean clothes to the hospital/surgery center.  FAILURE TO FOLLOW THESE INSTRUCTIONS MAY RESULT IN THE CANCELLATION OF YOUR SURGERY PATIENT SIGNATURE_________________________________  NURSE SIGNATURE__________________________________  ________________________________________________________________________

## 2019-03-26 ENCOUNTER — Other Ambulatory Visit: Payer: Self-pay

## 2019-03-26 ENCOUNTER — Encounter (HOSPITAL_COMMUNITY)
Admission: RE | Admit: 2019-03-26 | Discharge: 2019-03-26 | Disposition: A | Discharge status: Home / Self Care | Payer: BC Managed Care – PPO | Source: Ambulatory Visit | Attending: Surgery | Admitting: Surgery

## 2019-03-26 ENCOUNTER — Encounter (HOSPITAL_COMMUNITY): Payer: Self-pay

## 2019-03-26 DIAGNOSIS — Z01812 Encounter for preprocedural laboratory examination: Secondary | ICD-10-CM | POA: Insufficient documentation

## 2019-03-26 DIAGNOSIS — K8080 Other cholelithiasis without obstruction: Secondary | ICD-10-CM | POA: Diagnosis not present

## 2019-03-26 HISTORY — DX: Other specified postprocedural states: Z98.890

## 2019-03-26 HISTORY — DX: Other specified postprocedural states: R11.2

## 2019-03-26 LAB — CBC
HCT: 40.7 % (ref 36.0–46.0)
Hemoglobin: 12.7 g/dL (ref 12.0–15.0)
MCH: 28.2 pg (ref 26.0–34.0)
MCHC: 31.2 g/dL (ref 30.0–36.0)
MCV: 90.4 fL (ref 80.0–100.0)
Platelets: 227 10*3/uL (ref 150–400)
RBC: 4.5 MIL/uL (ref 3.87–5.11)
RDW: 13.1 % (ref 11.5–15.5)
WBC: 3.6 10*3/uL — ABNORMAL LOW (ref 4.0–10.5)
nRBC: 0 % (ref 0.0–0.2)

## 2019-03-27 ENCOUNTER — Other Ambulatory Visit (HOSPITAL_COMMUNITY)
Admission: RE | Admit: 2019-03-27 | Discharge: 2019-03-27 | Disposition: A | Discharge status: Home / Self Care | Payer: BC Managed Care – PPO | Source: Ambulatory Visit | Attending: Surgery | Admitting: Surgery

## 2019-03-27 DIAGNOSIS — K8080 Other cholelithiasis without obstruction: Secondary | ICD-10-CM | POA: Insufficient documentation

## 2019-03-27 DIAGNOSIS — L723 Sebaceous cyst: Secondary | ICD-10-CM | POA: Diagnosis not present

## 2019-03-27 DIAGNOSIS — Z20828 Contact with and (suspected) exposure to other viral communicable diseases: Secondary | ICD-10-CM | POA: Insufficient documentation

## 2019-03-27 DIAGNOSIS — Z01812 Encounter for preprocedural laboratory examination: Secondary | ICD-10-CM | POA: Insufficient documentation

## 2019-03-28 LAB — SARS CORONAVIRUS 2 (TAT 6-24 HRS): SARS Coronavirus 2: NEGATIVE

## 2019-03-30 NOTE — Anesthesia Preprocedure Evaluation (Addendum)
Anesthesia Evaluation  Patient identified by MRN, date of birth, ID band Patient awake    Reviewed: Allergy & Precautions, H&P , NPO status , Patient's Chart, lab work & pertinent test results  History of Anesthesia Complications (+) PONV and history of anesthetic complications  Airway Mallampati: I   Neck ROM: Full    Dental  (+) Teeth Intact, Dental Advisory Given   Pulmonary asthma ,    breath sounds clear to auscultation       Cardiovascular hypertension,  Rhythm:Regular Rate:Normal     Neuro/Psych  Headaches, PSYCHIATRIC DISORDERS Anxiety Depression  Neuromuscular disease    GI/Hepatic GERD  Controlled,  Endo/Other    Renal/GU      Musculoskeletal   Abdominal   Peds  Hematology   Anesthesia Other Findings   Reproductive/Obstetrics                            Anesthesia Physical  Anesthesia Plan  ASA: III  Anesthesia Plan: General   Post-op Pain Management:    Induction: Intravenous  PONV Risk Score and Plan: 4 or greater and Ondansetron, Dexamethasone, Treatment may vary due to age or medical condition, Propofol infusion, Scopolamine patch - Pre-op and Midazolam  Airway Management Planned: Oral ETT  Additional Equipment:   Intra-op Plan:   Post-operative Plan: Extubation in OR  Informed Consent: I have reviewed the patients History and Physical, chart, labs and discussed the procedure including the risks, benefits and alternatives for the proposed anesthesia with the patient or authorized representative who has indicated his/her understanding and acceptance.     Dental advisory given  Plan Discussed with: CRNA, Anesthesiologist and Surgeon  Anesthesia Plan Comments:        Anesthesia Quick Evaluation

## 2019-03-30 NOTE — H&P (Signed)
[facility]  Location: [facility] Patient #: [id number] DOB: 18-Mar-1976 Separated / Language: Pamela Lopez / Race: Black or African American Female   History of Present Illness  The patient is a 43 year old female who presents for evaluation of gall stones. This patient is referred by Dr. Juanita Craver for the evaluation of symptomatic gallstones. The patient has been having nausea with abdominal discomfort and diarrhea after fatty meals since late last year. She is currently undergoing a separation from her husband and is under a very large amount of stress. She has lost 30 pounds. She feels like this is more related to her abdominal pain and gallstones. The pain is moderate in intensity. It does not refer anywhere else other than the epigastrium and right upper quadrant. She underwent an ultrasound showing her to have gallstones. There was no gallbladder wall thickening. Her liver function tests were normal. She is otherwise without complaints.   Past Surgical History  Breast Biopsy  Right. Knee Surgery  Bilateral. Oral Surgery  Shoulder Surgery  Left.  Diagnostic Studies History Colonoscopy  1-5 years ago Mammogram  within last year Pap Smear  1-5 years ago  Allergies  Codeine/Codeine Derivatives  Difficulty breathing. HYDROcodone-Acetaminophen *ANALGESICS - OPIOID*  Difficulty breathing. Latex Gloves *MEDICAL DEVICES AND SUPPLIES*  Dermatitis. Monistat 1 Combo Pack *VAGINAL PRODUCTS*  Dermatitis. Allergies Reconciled   Medication History Junel FE 1/20 (1-20MG -MCG Tablet, Oral) Active. Fluconazole (150MG  Tablet, Oral) Active. Promethazine HCl (25MG  Tablet, Oral) Active. Medications Reconciled  Pregnancy / Birth History  Age at menarche  40 years. Contraceptive History  Oral contraceptives. Gravida  1 Maternal age  30-25 Para  0 Regular periods   Other Problems Arthritis  Asthma  Migraine Headache  Umbilical Hernia Repair      Review of Systems General Present- Appetite Loss, Fatigue and Weight Loss. Not Present- Chills, Fever, Night Sweats and Weight Gain. Gastrointestinal Present- Abdominal Pain, Change in Bowel Habits, Gets full quickly at meals, Indigestion and Nausea. Not Present- Bloating, Bloody Stool, Chronic diarrhea, Constipation, Difficulty Swallowing, Excessive gas, Hemorrhoids, Rectal Pain and Vomiting. Female Genitourinary Present- Frequency. Not Present- Nocturia, Painful Urination, Pelvic Pain and Urgency. Neurological Present- Headaches. Not Present- Decreased Memory, Fainting, Numbness, Seizures, Tingling, Tremor, Trouble walking and Weakness. Psychiatric Present- Anxiety and Change in Sleep Pattern. Not Present- Bipolar, Depression, Fearful and Frequent crying.  Vitals   Weight: 296.13 lb Temp.: 65F (Temporal)  Pulse: 86 (Regular)  P.OX: 96% (Room air) BP: 148/72(Sitting, Left Arm, Standard)    Physical Exam  General Mental Status-Alert. General Appearance-Consistent with stated age. Hydration-Well hydrated. Voice-Normal.  Head and Neck Head-normocephalic, atraumatic with no lesions or palpable masses.  Eye Eyeball - Bilateral-Extraocular movements intact. Sclera/Conjunctiva - Bilateral-No scleral icterus.  Chest and Lung Exam Chest and lung exam reveals -quiet, even and easy respiratory effort with no use of accessory muscles and on auscultation, normal breath sounds, no adventitious sounds and normal vocal resonance. Inspection Chest Wall - Normal. Back - normal.  Cardiovascular Cardiovascular examination reveals -on palpation PMI is normal in location and amplitude, no palpable S3 or S4. Normal cardiac borders., normal heart sounds, regular rate and rhythm with no murmurs, carotid auscultation reveals no bruits and normal pedal pulses bilaterally.  Abdomen Inspection Inspection of the abdomen reveals - No Hernias. Skin - Scar - no surgical  scars. Palpation/Percussion Palpation and Percussion of the abdomen reveal - Soft, No Rebound tenderness, No Rigidity (guarding) and No hepatosplenomegaly. Tenderness - Note: There is very mild tenderness in  the right upper quadrant. Dullness to percussion - Right Upper Quadrant. Auscultation Auscultation of the abdomen reveals - Bowel sounds normal.  Neurologic - Did not examine.  Musculoskeletal - Did not examine.    Assessment & Plan   SYMPTOMATIC CHOLELITHIASIS (K80.20)  Impression: I believe this patient does have symptomatic gallstones despite her current social stress. I have discussed this with her in detail. I recommended laparoscopic cholecystectomy. I gave her literature regarding gallbladders and gallbladder surgery. We discussed the procedure in detail. At this point, she will hold on surgery giving her pending potential divorce. I explained again the signs and symptoms of cholecystitis and potential complications of gallstones and stones in the bile duct. She agrees to proceed to the OR

## 2019-03-31 ENCOUNTER — Encounter (HOSPITAL_COMMUNITY)
Admission: RE | Disposition: A | Discharge status: Home / Self Care | Payer: Self-pay | Source: Ambulatory Visit | Attending: Surgery

## 2019-03-31 ENCOUNTER — Encounter (HOSPITAL_COMMUNITY): Payer: Self-pay

## 2019-03-31 ENCOUNTER — Other Ambulatory Visit: Payer: Self-pay

## 2019-03-31 ENCOUNTER — Ambulatory Visit (HOSPITAL_COMMUNITY): Payer: BC Managed Care – PPO | Admitting: Certified Registered Nurse Anesthetist

## 2019-03-31 ENCOUNTER — Ambulatory Visit (HOSPITAL_COMMUNITY): Payer: BC Managed Care – PPO | Admitting: Physician Assistant

## 2019-03-31 ENCOUNTER — Ambulatory Visit (HOSPITAL_COMMUNITY)
Admission: RE | Admit: 2019-03-31 | Discharge: 2019-03-31 | Disposition: A | Discharge status: Home / Self Care | Payer: BC Managed Care – PPO | Source: Ambulatory Visit | Attending: Surgery | Admitting: Surgery

## 2019-03-31 DIAGNOSIS — Z888 Allergy status to other drugs, medicaments and biological substances status: Secondary | ICD-10-CM | POA: Insufficient documentation

## 2019-03-31 DIAGNOSIS — F329 Major depressive disorder, single episode, unspecified: Secondary | ICD-10-CM | POA: Insufficient documentation

## 2019-03-31 DIAGNOSIS — Z885 Allergy status to narcotic agent status: Secondary | ICD-10-CM | POA: Insufficient documentation

## 2019-03-31 DIAGNOSIS — F419 Anxiety disorder, unspecified: Secondary | ICD-10-CM | POA: Insufficient documentation

## 2019-03-31 DIAGNOSIS — I10 Essential (primary) hypertension: Secondary | ICD-10-CM | POA: Diagnosis not present

## 2019-03-31 DIAGNOSIS — K8010 Calculus of gallbladder with chronic cholecystitis without obstruction: Secondary | ICD-10-CM | POA: Insufficient documentation

## 2019-03-31 DIAGNOSIS — G709 Myoneural disorder, unspecified: Secondary | ICD-10-CM | POA: Diagnosis not present

## 2019-03-31 DIAGNOSIS — K219 Gastro-esophageal reflux disease without esophagitis: Secondary | ICD-10-CM | POA: Insufficient documentation

## 2019-03-31 DIAGNOSIS — K828 Other specified diseases of gallbladder: Secondary | ICD-10-CM | POA: Diagnosis not present

## 2019-03-31 DIAGNOSIS — Z91040 Latex allergy status: Secondary | ICD-10-CM | POA: Insufficient documentation

## 2019-03-31 DIAGNOSIS — M1990 Unspecified osteoarthritis, unspecified site: Secondary | ICD-10-CM | POA: Diagnosis not present

## 2019-03-31 DIAGNOSIS — G43909 Migraine, unspecified, not intractable, without status migrainosus: Secondary | ICD-10-CM | POA: Insufficient documentation

## 2019-03-31 DIAGNOSIS — Z79899 Other long term (current) drug therapy: Secondary | ICD-10-CM | POA: Diagnosis not present

## 2019-03-31 DIAGNOSIS — J45909 Unspecified asthma, uncomplicated: Secondary | ICD-10-CM | POA: Insufficient documentation

## 2019-03-31 HISTORY — PX: CHOLECYSTECTOMY: SHX55

## 2019-03-31 HISTORY — PX: IRRIGATION AND DEBRIDEMENT SEBACEOUS CYST: SHX5255

## 2019-03-31 SURGERY — LAPAROSCOPIC CHOLECYSTECTOMY
Anesthesia: General

## 2019-03-31 MED ORDER — SUCCINYLCHOLINE CHLORIDE 200 MG/10ML IV SOSY
PREFILLED_SYRINGE | INTRAVENOUS | Status: DC | PRN
  Administered 2019-03-31: 120 mg via INTRAVENOUS

## 2019-03-31 MED ORDER — 0.9 % SODIUM CHLORIDE (POUR BTL) OPTIME
TOPICAL | Status: DC | PRN
  Administered 2019-03-31: 1000 mL

## 2019-03-31 MED ORDER — LIDOCAINE 2% (20 MG/ML) 5 ML SYRINGE
INTRAMUSCULAR | Status: AC
  Filled 2019-03-31: qty 5

## 2019-03-31 MED ORDER — PHENYLEPHRINE 40 MCG/ML (10ML) SYRINGE FOR IV PUSH (FOR BLOOD PRESSURE SUPPORT)
PREFILLED_SYRINGE | INTRAVENOUS | Status: AC
  Filled 2019-03-31: qty 10

## 2019-03-31 MED ORDER — ACETAMINOPHEN 500 MG PO TABS
1000.0000 mg | ORAL_TABLET | ORAL | Status: AC
  Administered 2019-03-31: 1000 mg via ORAL
  Filled 2019-03-31: qty 2

## 2019-03-31 MED ORDER — FENTANYL CITRATE (PF) 100 MCG/2ML IJ SOLN
25.0000 ug | INTRAMUSCULAR | Status: DC | PRN
  Administered 2019-03-31 (×3): 50 ug via INTRAVENOUS

## 2019-03-31 MED ORDER — BUPIVACAINE HCL (PF) 0.5 % IJ SOLN
INTRAMUSCULAR | Status: AC
  Filled 2019-03-31: qty 30

## 2019-03-31 MED ORDER — OXYCODONE HCL 5 MG PO TABS: 5.0000 mg | ORAL_TABLET | Freq: Four times a day (QID) | ORAL | 0 refills | Status: DC | PRN

## 2019-03-31 MED ORDER — PROPOFOL 500 MG/50ML IV EMUL
INTRAVENOUS | Status: DC | PRN
  Administered 2019-03-31: 20 ug/kg/min via INTRAVENOUS

## 2019-03-31 MED ORDER — SCOPOLAMINE 1 MG/3DAYS TD PT72: 1.0000 | MEDICATED_PATCH | TRANSDERMAL | Status: DC

## 2019-03-31 MED ORDER — PHENYLEPHRINE 40 MCG/ML (10ML) SYRINGE FOR IV PUSH (FOR BLOOD PRESSURE SUPPORT)
PREFILLED_SYRINGE | INTRAVENOUS | Status: DC | PRN
  Administered 2019-03-31: 80 ug via INTRAVENOUS

## 2019-03-31 MED ORDER — HYDRALAZINE HCL 20 MG/ML IJ SOLN
10.0000 mg | Freq: Once | INTRAMUSCULAR | Status: AC
  Administered 2019-03-31: 11:00:00 10 mg via INTRAVENOUS

## 2019-03-31 MED ORDER — CIPROFLOXACIN IN D5W 400 MG/200ML IV SOLN
400.0000 mg | INTRAVENOUS | Status: AC
  Administered 2019-03-31: 08:00:00 400 mg via INTRAVENOUS
  Filled 2019-03-31: qty 200

## 2019-03-31 MED ORDER — FENTANYL CITRATE (PF) 100 MCG/2ML IJ SOLN
INTRAMUSCULAR | Status: AC
  Filled 2019-03-31: qty 2

## 2019-03-31 MED ORDER — SUCCINYLCHOLINE CHLORIDE 200 MG/10ML IV SOSY
PREFILLED_SYRINGE | INTRAVENOUS | Status: AC
  Filled 2019-03-31: qty 10

## 2019-03-31 MED ORDER — HYDRALAZINE HCL 20 MG/ML IJ SOLN
10.0000 mg | Freq: Four times a day (QID) | INTRAMUSCULAR | Status: DC | PRN
  Administered 2019-03-31: 10 mg via INTRAVENOUS

## 2019-03-31 MED ORDER — PROPOFOL 10 MG/ML IV BOLUS
INTRAVENOUS | Status: DC | PRN
  Administered 2019-03-31: 200 mg via INTRAVENOUS

## 2019-03-31 MED ORDER — DEXAMETHASONE SODIUM PHOSPHATE 10 MG/ML IJ SOLN
INTRAMUSCULAR | Status: DC | PRN
  Administered 2019-03-31: 10 mg via INTRAVENOUS

## 2019-03-31 MED ORDER — FENTANYL CITRATE (PF) 100 MCG/2ML IJ SOLN
INTRAMUSCULAR | Status: DC | PRN
  Administered 2019-03-31 (×4): 50 ug via INTRAVENOUS

## 2019-03-31 MED ORDER — PROPOFOL 10 MG/ML IV BOLUS
INTRAVENOUS | Status: AC
  Filled 2019-03-31: qty 20

## 2019-03-31 MED ORDER — CHLORHEXIDINE GLUCONATE CLOTH 2 % EX PADS: 6.0000 | MEDICATED_PAD | Freq: Once | CUTANEOUS | Status: DC

## 2019-03-31 MED ORDER — DEXAMETHASONE SODIUM PHOSPHATE 10 MG/ML IJ SOLN
INTRAMUSCULAR | Status: AC
  Filled 2019-03-31: qty 1

## 2019-03-31 MED ORDER — BUPIVACAINE HCL (PF) 0.5 % IJ SOLN
INTRAMUSCULAR | Status: DC | PRN
  Administered 2019-03-31: 30 mL

## 2019-03-31 MED ORDER — LIDOCAINE 2% (20 MG/ML) 5 ML SYRINGE
INTRAMUSCULAR | Status: DC | PRN
  Administered 2019-03-31: 100 mg via INTRAVENOUS

## 2019-03-31 MED ORDER — ONDANSETRON HCL 4 MG/2ML IJ SOLN
4.0000 mg | Freq: Once | INTRAMUSCULAR | Status: AC
  Administered 2019-03-31: 08:00:00 4 mg via INTRAVENOUS

## 2019-03-31 MED ORDER — OXYCODONE HCL 5 MG/5ML PO SOLN: 5.0000 mg | Freq: Once | ORAL | Status: DC | PRN

## 2019-03-31 MED ORDER — ROCURONIUM BROMIDE 50 MG/5ML IV SOSY
PREFILLED_SYRINGE | INTRAVENOUS | Status: DC | PRN
  Administered 2019-03-31: 25 mg via INTRAVENOUS

## 2019-03-31 MED ORDER — ONDANSETRON HCL 4 MG/2ML IJ SOLN
INTRAMUSCULAR | Status: AC
  Filled 2019-03-31: qty 2

## 2019-03-31 MED ORDER — GLYCOPYRROLATE PF 0.2 MG/ML IJ SOSY
PREFILLED_SYRINGE | INTRAMUSCULAR | Status: DC | PRN
  Administered 2019-03-31: 0.4 mg via INTRAVENOUS

## 2019-03-31 MED ORDER — ONDANSETRON HCL 4 MG/2ML IJ SOLN: 4.0000 mg | Freq: Once | INTRAMUSCULAR | Status: DC | PRN

## 2019-03-31 MED ORDER — NEOSTIGMINE METHYLSULFATE 3 MG/3ML IV SOSY
PREFILLED_SYRINGE | INTRAVENOUS | Status: DC | PRN
  Administered 2019-03-31: 3 mg via INTRAVENOUS

## 2019-03-31 MED ORDER — ROCURONIUM BROMIDE 10 MG/ML (PF) SYRINGE
PREFILLED_SYRINGE | INTRAVENOUS | Status: AC
  Filled 2019-03-31: qty 10

## 2019-03-31 MED ORDER — GABAPENTIN 300 MG PO CAPS
300.0000 mg | ORAL_CAPSULE | ORAL | Status: AC
  Administered 2019-03-31: 07:00:00 300 mg via ORAL
  Filled 2019-03-31: qty 1

## 2019-03-31 MED ORDER — FENTANYL CITRATE (PF) 250 MCG/5ML IJ SOLN
INTRAMUSCULAR | Status: AC
  Filled 2019-03-31: qty 5

## 2019-03-31 MED ORDER — CELECOXIB 200 MG PO CAPS
200.0000 mg | ORAL_CAPSULE | ORAL | Status: AC
  Administered 2019-03-31: 200 mg via ORAL
  Filled 2019-03-31: qty 1

## 2019-03-31 MED ORDER — MIDAZOLAM HCL 5 MG/5ML IJ SOLN
INTRAMUSCULAR | Status: DC | PRN
  Administered 2019-03-31: 2 mg via INTRAVENOUS

## 2019-03-31 MED ORDER — SCOPOLAMINE 1 MG/3DAYS TD PT72
MEDICATED_PATCH | TRANSDERMAL | Status: AC
  Administered 2019-03-31: 08:00:00
  Filled 2019-03-31: qty 1

## 2019-03-31 MED ORDER — MEPERIDINE HCL 50 MG/ML IJ SOLN: 6.2500 mg | INTRAMUSCULAR | Status: DC | PRN

## 2019-03-31 MED ORDER — HYDRALAZINE HCL 20 MG/ML IJ SOLN
INTRAMUSCULAR | Status: AC
  Filled 2019-03-31: qty 1

## 2019-03-31 MED ORDER — MIDAZOLAM HCL 2 MG/2ML IJ SOLN
INTRAMUSCULAR | Status: AC
  Filled 2019-03-31: qty 2

## 2019-03-31 MED ORDER — ACETAMINOPHEN 160 MG/5ML PO SOLN: 325.0000 mg | ORAL | Status: DC | PRN

## 2019-03-31 MED ORDER — ACETAMINOPHEN 325 MG PO TABS: 325.0000 mg | ORAL_TABLET | ORAL | Status: DC | PRN

## 2019-03-31 MED ORDER — LACTATED RINGERS IV SOLN
INTRAVENOUS | Status: DC | PRN
  Administered 2019-03-31 (×2): via INTRAVENOUS

## 2019-03-31 MED ORDER — OXYCODONE HCL 5 MG PO TABS: 5.0000 mg | ORAL_TABLET | Freq: Once | ORAL | Status: DC | PRN

## 2019-03-31 MED ORDER — HYDRALAZINE HCL 20 MG/ML IJ SOLN: 10.0000 mg | Freq: Four times a day (QID) | INTRAMUSCULAR | Status: DC | PRN

## 2019-03-31 MED ORDER — ONDANSETRON HCL 4 MG/2ML IJ SOLN
INTRAMUSCULAR | Status: DC | PRN
  Administered 2019-03-31: 4 mg via INTRAVENOUS

## 2019-03-31 SURGICAL SUPPLY — 33 items
ADH SKN CLS APL DERMABOND .7 (GAUZE/BANDAGES/DRESSINGS) ×4
APL PRP STRL LF DISP 70% ISPRP (MISCELLANEOUS) ×2
APPLIER CLIP 5 13 M/L LIGAMAX5 (MISCELLANEOUS) ×4
APR CLP MED LRG 5 ANG JAW (MISCELLANEOUS) ×2
BAG SPEC RTRVL LRG 6X4 10 (ENDOMECHANICALS) ×2
CABLE HIGH FREQUENCY MONO STRZ (ELECTRODE) ×4 IMPLANT
CHLORAPREP W/TINT 26 (MISCELLANEOUS) ×4 IMPLANT
CLIP APPLIE 5 13 M/L LIGAMAX5 (MISCELLANEOUS) ×2 IMPLANT
COVER MAYO STAND STRL (DRAPES) IMPLANT
COVER WAND RF STERILE (DRAPES) IMPLANT
DECANTER SPIKE VIAL GLASS SM (MISCELLANEOUS) ×4 IMPLANT
DERMABOND ADVANCED (GAUZE/BANDAGES/DRESSINGS) ×4
DERMABOND ADVANCED .7 DNX12 (GAUZE/BANDAGES/DRESSINGS) ×2 IMPLANT
DRAPE C-ARM 42X120 X-RAY (DRAPES) IMPLANT
ELECT REM PT RETURN 15FT ADLT (MISCELLANEOUS) ×4 IMPLANT
GLOVE SURG SIGNA 7.5 PF LTX (GLOVE) ×1 IMPLANT
GLOVE SURG SS PI 7.5 STRL IVOR (GLOVE) ×4 IMPLANT
GOWN STRL REUS W/TWL XL LVL3 (GOWN DISPOSABLE) ×8 IMPLANT
HEMOSTAT SURGICEL 4X8 (HEMOSTASIS) IMPLANT
KIT BASIN OR (CUSTOM PROCEDURE TRAY) ×4 IMPLANT
KIT TURNOVER KIT A (KITS) IMPLANT
POUCH SPECIMEN RETRIEVAL 10MM (ENDOMECHANICALS) ×4 IMPLANT
SCISSORS LAP 5X35 DISP (ENDOMECHANICALS) ×4 IMPLANT
SET CHOLANGIOGRAPH MIX (MISCELLANEOUS) IMPLANT
SET IRRIG TUBING LAPAROSCOPIC (IRRIGATION / IRRIGATOR) ×4 IMPLANT
SET TUBE SMOKE EVAC HIGH FLOW (TUBING) ×4 IMPLANT
SLEEVE XCEL OPT CAN 5 100 (ENDOMECHANICALS) ×8 IMPLANT
SUT MNCRL AB 4-0 PS2 18 (SUTURE) ×4 IMPLANT
TOWEL OR 17X26 10 PK STRL BLUE (TOWEL DISPOSABLE) ×4 IMPLANT
TOWEL OR NON WOVEN STRL DISP B (DISPOSABLE) ×4 IMPLANT
TRAY LAPAROSCOPIC (CUSTOM PROCEDURE TRAY) ×4 IMPLANT
TROCAR BLADELESS OPT 5 100 (ENDOMECHANICALS) ×4 IMPLANT
TROCAR XCEL BLUNT TIP 100MML (ENDOMECHANICALS) ×4 IMPLANT

## 2019-03-31 NOTE — Op Note (Signed)
Laparoscopic Cholecystectomy Procedure Note, excision 5 mm left thigh sebaceous cyst  Indications: This patient presents with symptomatic gallbladder disease and will undergo laparoscopic cholecystectomy.  Pre-operative Diagnosis: symptomatic cholelithiasis, left thigh 5 mm sebaceous cyst  Post-operative Diagnosis: Same  Surgeon: Coralie Keens   Assistants: 0  Anesthesia: General endotracheal anesthesia  ASA Class: 2  Procedure Details  The patient was seen again in the Holding Room. The risks, benefits, complications, treatment options, and expected outcomes were discussed with the patient. The possibilities of reaction to medication, pulmonary aspiration, perforation of viscus, bleeding, recurrent infection, finding a normal gallbladder, the need for additional procedures, failure to diagnose a condition, the possible need to convert to an open procedure, and creating a complication requiring transfusion or operation were discussed with the patient. The likelihood of improving the patient's symptoms with return to their baseline status is good.  The patient and/or family concurred with the proposed plan, giving informed consent. The site of surgery properly noted. The patient was taken to Operating Room, identified as [facility] and the procedure verified as Laparoscopic Cholecystectomy with Intraoperative Cholangiogram. A Time Out was held and the above information confirmed.  Prior to the induction of general anesthesia, antibiotic prophylaxis was administered. General endotracheal anesthesia was then administered and tolerated well. After the induction, the abdomen was prepped with Chloraprep and draped in sterile fashion. The patient was positioned in the supine position.  Local anesthetic agent was injected into the skin near the umbilicus and an incision made. We dissected down to the abdominal fascia with blunt dissection.  The fascia was incised vertically and we entered the  peritoneal cavity bluntly.  A pursestring suture of 0-Vicryl was placed around the fascial opening.  The Hasson cannula was inserted and secured with the stay suture.  Pneumoperitoneum was then created with CO2 and tolerated well without any adverse changes in the patient's vital signs. A 5-mm port was placed in the subxiphoid position.  Two 5-mm ports were placed in the right upper quadrant. All skin incisions were infiltrated with a local anesthetic agent before making the incision and placing the trocars.   We positioned the patient in reverse Trendelenburg, tilted slightly to the patient's left.  The gallbladder was identified, the fundus grasped and retracted cephalad. Adhesions were lysed bluntly and with the electrocautery where indicated, taking care not to injure any adjacent organs or viscus. The infundibulum was grasped and retracted laterally, exposing the peritoneum overlying the triangle of Calot. This was then divided and exposed in a blunt fashion. The cystic duct was clearly identified and bluntly dissected circumferentially. A critical view of the cystic duct and cystic artery was obtained.  The cystic duct was then ligated with clips and divided. The cystic artery was, dissected free, ligated with clips and divided as well.   The gallbladder was dissected from the liver bed in retrograde fashion with the electrocautery. The gallbladder was removed and placed in an Endocatch sac. The liver bed was irrigated and inspected. Hemostasis was achieved with the electrocautery. Copious irrigation was utilized and was repeatedly aspirated until clear.  The gallbladder and Endocatch sac were then removed through the umbilical port site.  The pursestring suture was used to close the umbilical fascia.    We again inspected the right upper quadrant for hemostasis.  Pneumoperitoneum was released as we removed the trocars.  4-0 Monocryl was used to close the skin.  Skin glue was then applied.   We next  lifted the patient's left  leg.  There was a small, 5 mm posterior thigh sebaceous cyst.  I excised it with a scalpel, anesthetized the wound with Marcaine, and then closed with Dermabond.  A Band-Aid was then applied.    The patient was then extubated and brought to the recovery room in stable condition. Instrument, sponge, and needle counts were correct at closure and at the conclusion of the case.   Findings: Cholecystitis with Cholelithiasis  Estimated Blood Loss: Minimal         Drains: 0         Specimens: Gallbladder           Complications: None; patient tolerated the procedure well.         Disposition: PACU - hemodynamically stable.         Condition: stable

## 2019-03-31 NOTE — Discharge Instructions (Signed)
[facility] ______[facility] °LAPAROSCOPIC SURGERY: POST OP INSTRUCTIONS °Always review your discharge instruction sheet given to you by the facility where your surgery was performed. °IF YOU HAVE DISABILITY OR FAMILY LEAVE FORMS, YOU MUST BRING THEM TO THE OFFICE FOR PROCESSING.   °DO NOT GIVE THEM TO YOUR DOCTOR. ° °1. A prescription for pain medication may be given to you upon discharge.  Take your pain medication as prescribed, if needed.  If narcotic pain medicine is not needed, then you may take acetaminophen (Tylenol) or ibuprofen (Advil) as needed. °2. Take your usually prescribed medications unless otherwise directed. °3. If you need a refill on your pain medication, please contact your pharmacy.  They will contact our office to request authorization. Prescriptions will not be filled after 5pm or on week-ends. °4. You should follow a light diet the first few days after arrival home, such as soup and crackers, etc.  Be sure to include lots of fluids daily. °5. Most patients will experience some swelling and bruising in the area of the incisions.  Ice packs will help.  Swelling and bruising can take several days to resolve.  °6. It is common to experience some constipation if taking pain medication after surgery.  Increasing fluid intake and taking a stool softener (such as Colace) will usually help or prevent this problem from occurring.  A mild laxative (Milk of Magnesia or Miralax) should be taken according to package instructions if there are no bowel movements after 48 hours. °7. Unless discharge instructions indicate otherwise, you may remove your bandages 24-48 hours after surgery, and you may shower at that time.  You may have steri-strips (small skin tapes) in place directly over the incision.  These strips should be left on the skin for 7-10 days.  If your surgeon used skin glue on the incision, you may shower in 24 hours.  The glue will flake off over the next 2-3 weeks.  Any sutures or  staples will be removed at the office during your follow-up visit. °8. ACTIVITIES:  You may resume regular (light) daily activities beginning the next day--such as daily self-care, walking, climbing stairs--gradually increasing activities as tolerated.  You may have sexual intercourse when it is comfortable.  Refrain from any heavy lifting or straining until approved by your doctor. °a. You may drive when you are no longer taking prescription pain medication, you can comfortably wear a seatbelt, and you can safely maneuver your car and apply brakes. °b. RETURN TO WORK:  __________________________________________________________ °9. You should see your doctor in the office for a follow-up appointment approximately 2-3 weeks after your surgery.  Make sure that you call for this appointment within a day or two after you arrive home to insure a convenient appointment time. °10. OTHER INSTRUCTIONS:OK TO SHOWER STARTING TOMORROW °11. ICE PACK, TYLENOL, AND IBUPROFEN ALSO FOR PAIN °12. NO LIFTING MORE THAN 15 TO 20 POUNDS FOR 2 WEEKS __________________________________________________________________________________________________________________________ __________________________________________________________________________________________________________________________ °WHEN TO CALL YOUR DOCTOR: °1. Fever over 101.0 °2. Inability to urinate °3. Continued bleeding from incision. °4. Increased pain, redness, or drainage from the incision. °5. Increasing abdominal pain ° °The clinic staff is available to answer your questions during regular business hours.  Please don’t hesitate to call and ask to speak to one of the nurses for clinical concerns.  If you have a medical emergency, go to the nearest emergency room or call 911.  A surgeon from [facility] is always on call at the hospital. °[address],   [address], [place], [place]  [address] ? [address] [address], [place],    [address] °[phone] ?  [phone] ? [phone] [phone] °Web site: [email or web address] °

## 2019-03-31 NOTE — Interval H&P Note (Deleted)
History and Physical Interval Note:no change in H and P  03/31/2019 7:14 AM  Dannica R Matier  has presented today for surgery, with the diagnosis of SYMPTOMATIC GALLSTONES.  The various methods of treatment have been discussed with the patient and family. After consideration of risks, benefits and other options for treatment, the patient has consented to  Procedure(s): LAPAROSCOPIC CHOLECYSTECTOMY (N/A) as a surgical intervention.  The patient's history has been reviewed, patient examined, no change in status, stable for surgery.  I have reviewed the patient's chart and labs.  Questions were answered to the patient's satisfaction.     Coralie Keens

## 2019-03-31 NOTE — Transfer of Care (Signed)
Immediate Anesthesia Transfer of Care Note  Patient: Pamela Lopez  Procedure(s) Performed: LAPAROSCOPIC CHOLECYSTECTOMY (N/A ) REMOVAL OF SEBACEOUS CYST FROM LEFT UPPER POSTERIOR THIGH  Patient Location: PACU  Anesthesia Type:General  Level of Consciousness: drowsy and patient cooperative  Airway & Oxygen Therapy: Patient Spontanous Breathing and Patient connected to face mask oxygen  Post-op Assessment: Report given to RN and Post -op Vital signs reviewed and stable  Post vital signs: Reviewed and stable  Last Vitals:  Vitals Value Taken Time  BP 161/111 03/31/19 0921  Temp    Pulse 84 03/31/19 0922  Resp 19 03/31/19 0922  SpO2 100 % 03/31/19 0922  Vitals shown include unvalidated device data.  Last Pain:  Vitals:   03/31/19 0725  TempSrc:   PainSc: 2       Patients Stated Pain Goal: 4 (41/93/79 0240)  Complications: No apparent anesthesia complications

## 2019-03-31 NOTE — Interval H&P Note (Signed)
History and Physical Interval Note: no change in H and P except patient has a small symptomatic left posterior thigh cyst about 5 mm in size and is requesting excision.  Will remove this in OR at end of lap chole.  Risks were again discussed  03/31/2019 7:21 AM  Fausto Skillern R Matier  has presented today for surgery, with the diagnosis of SYMPTOMATIC GALLSTONES AND LEFT THIGH CYST  The various methods of treatment have been discussed with the patient and family. After consideration of risks, benefits and other options for treatment, the patient has consented to  Procedure(s): LAPAROSCOPIC CHOLECYSTECTOMY (N/A) AND EXCISION OF LEFT THIGH CYST as a surgical intervention.  The patient's history has been reviewed, patient examined, no change in status, stable for surgery.  I have reviewed the patient's chart and labs.  Questions were answered to the patient's satisfaction.     Coralie Keens

## 2019-03-31 NOTE — Anesthesia Procedure Notes (Signed)
Procedure Name: Intubation Date/Time: 03/31/2019 8:31 AM Performed by: Montel Clock, CRNA Pre-anesthesia Checklist: Patient identified, Emergency Drugs available, Suction available, Patient being monitored and Timeout performed Patient Re-evaluated:Patient Re-evaluated prior to induction Oxygen Delivery Method: Circle system utilized Preoxygenation: Pre-oxygenation with 100% oxygen Induction Type: IV induction and Rapid sequence Laryngoscope Size: Mac and 3 Grade View: Grade I Tube type: Oral Tube size: 7.0 mm Number of attempts: 1 Airway Equipment and Method: Stylet Placement Confirmation: ETT inserted through vocal cords under direct vision,  positive ETCO2 and breath sounds checked- equal and bilateral Secured at: 21 cm Tube secured with: Tape Dental Injury: Teeth and Oropharynx as per pre-operative assessment

## 2019-03-31 NOTE — Anesthesia Postprocedure Evaluation (Signed)
Anesthesia Post Note  Patient: Pamela Lopez  Procedure(s) Performed: LAPAROSCOPIC CHOLECYSTECTOMY (N/A ) REMOVAL OF SEBACEOUS CYST FROM LEFT UPPER POSTERIOR THIGH     Patient location during evaluation: PACU Anesthesia Type: General Level of consciousness: awake and alert Pain management: pain level controlled Vital Signs Assessment: post-procedure vital signs reviewed and stable Respiratory status: spontaneous breathing, nonlabored ventilation, respiratory function stable and patient connected to nasal cannula oxygen Cardiovascular status: blood pressure returned to baseline and stable Postop Assessment: no apparent nausea or vomiting Anesthetic complications: no    Last Vitals:  Vitals:   03/31/19 1115 03/31/19 1134  BP: 127/85 116/73  Pulse: 90 80  Resp:  18  Temp:  36.7 C  SpO2: 98% 100%    Last Pain:  Vitals:   03/31/19 1134  TempSrc:   PainSc: 0-No pain                 Meiko Ives

## 2019-04-01 ENCOUNTER — Encounter (HOSPITAL_COMMUNITY): Payer: Self-pay | Admitting: Surgery

## 2019-06-16 ENCOUNTER — Encounter: Payer: Self-pay | Admitting: Family Medicine

## 2019-06-16 ENCOUNTER — Ambulatory Visit (INDEPENDENT_AMBULATORY_CARE_PROVIDER_SITE_OTHER): Payer: BC Managed Care – PPO | Admitting: Family Medicine

## 2019-06-16 ENCOUNTER — Other Ambulatory Visit: Payer: Self-pay

## 2019-06-16 VITALS — BP 120/80 | HR 78 | Temp 97.6°F | Ht 66.0 in | Wt 254.6 lb

## 2019-06-16 DIAGNOSIS — Z0000 Encounter for general adult medical examination without abnormal findings: Secondary | ICD-10-CM | POA: Diagnosis not present

## 2019-06-16 DIAGNOSIS — E559 Vitamin D deficiency, unspecified: Secondary | ICD-10-CM | POA: Diagnosis not present

## 2019-06-16 DIAGNOSIS — Z23 Encounter for immunization: Secondary | ICD-10-CM | POA: Diagnosis not present

## 2019-06-16 LAB — POC URINALSYSI DIPSTICK (AUTOMATED)
Glucose, UA: NEGATIVE
Leukocytes, UA: NEGATIVE
Protein, UA: POSITIVE — AB
Spec Grav, UA: 1.025 (ref 1.010–1.025)
Urobilinogen, UA: 0.2 E.U./dL
pH, UA: 6 (ref 5.0–8.0)

## 2019-06-16 LAB — CBC WITH DIFFERENTIAL/PLATELET
Basophils Absolute: 0 10*3/uL (ref 0.0–0.1)
Basophils Relative: 0.9 % (ref 0.0–3.0)
Eosinophils Absolute: 0.1 10*3/uL (ref 0.0–0.7)
Eosinophils Relative: 2 % (ref 0.0–5.0)
HCT: 38.5 % (ref 36.0–46.0)
Hemoglobin: 12.7 g/dL (ref 12.0–15.0)
Lymphocytes Relative: 31.7 % (ref 12.0–46.0)
Lymphs Abs: 1.4 10*3/uL (ref 0.7–4.0)
MCHC: 33 g/dL (ref 30.0–36.0)
MCV: 86.4 fl (ref 78.0–100.0)
Monocytes Absolute: 0.3 10*3/uL (ref 0.1–1.0)
Monocytes Relative: 7.5 % (ref 3.0–12.0)
Neutro Abs: 2.5 10*3/uL (ref 1.4–7.7)
Neutrophils Relative %: 57.9 % (ref 43.0–77.0)
Platelets: 225 10*3/uL (ref 150.0–400.0)
RBC: 4.46 Mil/uL (ref 3.87–5.11)
RDW: 13.9 % (ref 11.5–15.5)
WBC: 4.3 10*3/uL (ref 4.0–10.5)

## 2019-06-16 LAB — BASIC METABOLIC PANEL
BUN: 19 mg/dL (ref 6–23)
CO2: 26 mEq/L (ref 19–32)
Calcium: 8.8 mg/dL (ref 8.4–10.5)
Chloride: 105 mEq/L (ref 96–112)
Creatinine, Ser: 0.74 mg/dL (ref 0.40–1.20)
GFR: 104.06 mL/min (ref >60.00)
Glucose, Bld: 83 mg/dL (ref 70–99)
Potassium: 4.1 mEq/L (ref 3.5–5.1)
Sodium: 139 mEq/L (ref 135–145)

## 2019-06-16 LAB — HEPATIC FUNCTION PANEL
ALT: 8 U/L (ref 0–35)
AST: 10 U/L (ref 0–37)
Albumin: 3.9 g/dL (ref 3.5–5.2)
Alkaline Phosphatase: 43 U/L (ref 39–117)
Bilirubin, Direct: 0.1 mg/dL (ref 0.0–0.3)
Total Bilirubin: 0.3 mg/dL (ref 0.2–1.2)
Total Protein: 6.6 g/dL (ref 6.0–8.3)

## 2019-06-16 LAB — LIPID PANEL
Cholesterol: 163 mg/dL (ref 0–200)
HDL: 59.1 mg/dL (ref >39.00)
LDL Cholesterol: 88 mg/dL (ref 0–99)
NonHDL: 103.84
Total CHOL/HDL Ratio: 3
Triglycerides: 81 mg/dL (ref 0.0–149.0)
VLDL: 16.2 mg/dL (ref 0.0–40.0)

## 2019-06-16 MED ORDER — PROMETHAZINE HCL 25 MG PO TABS: ORAL_TABLET | ORAL | 3 refills | Status: DC

## 2019-06-16 MED ORDER — ALBUTEROL SULFATE HFA 108 (90 BASE) MCG/ACT IN AERS: 2.0000 | INHALATION_SPRAY | RESPIRATORY_TRACT | 5 refills | Status: DC | PRN

## 2019-06-16 NOTE — Patient Instructions (Signed)
Health Maintenance Due  Topic Date Due  . TETANUS/TDAP  04/19/1996  . INFLUENZA VACCINE  03/20/2019  . PAP SMEAR-Modifier  07/05/2019    No flowsheet data found.

## 2019-06-16 NOTE — Progress Notes (Signed)
   Subjective:    Patient ID: Pamela Lopez, female    DOB: Apr 26, 1976, 43 y.o.   MRN: [id number]  HPI Here for a well exam. Her only complaint is some fatigue, which she thinks is due to stress. She has been separated from her husband for the past year and they have not been able to work things out very well. She had her gall bladder removed this summer and this went well.    Review of Systems  Constitutional: Positive for fatigue.  HENT: Negative.   Eyes: Negative.   Respiratory: Negative.   Cardiovascular: Negative.   Gastrointestinal: Negative.   Genitourinary: Negative for decreased urine volume, difficulty urinating, dyspareunia, dysuria, enuresis, flank pain, frequency, hematuria, pelvic pain and urgency.  Musculoskeletal: Negative.   Skin: Negative.   Neurological: Negative.   Psychiatric/Behavioral: Negative.        Objective:   Physical Exam Constitutional:      General: She is not in acute distress.    Appearance: She is well-developed. She is obese.  HENT:     Head: Normocephalic and atraumatic.     Right Ear: External ear normal.     Left Ear: External ear normal.     Nose: Nose normal.     Mouth/Throat:     Pharynx: No oropharyngeal exudate.  Eyes:     General: No scleral icterus.    Conjunctiva/sclera: Conjunctivae normal.     Pupils: Pupils are equal, round, and reactive to light.  Neck:     Musculoskeletal: Normal range of motion and neck supple.     Thyroid: No thyromegaly.     Vascular: No JVD.  Cardiovascular:     Rate and Rhythm: Normal rate and regular rhythm.     Heart sounds: Normal heart sounds. No murmur. No friction rub. No gallop.   Pulmonary:     Effort: Pulmonary effort is normal. No respiratory distress.     Breath sounds: Normal breath sounds. No wheezing or rales.  Chest:     Chest wall: No tenderness.  Abdominal:     General: Bowel sounds are normal. There is no distension.     Palpations: Abdomen is soft. There is no mass.   Tenderness: There is no abdominal tenderness. There is no guarding or rebound.  Musculoskeletal: Normal range of motion.        General: No tenderness.  Lymphadenopathy:     Cervical: No cervical adenopathy.  Skin:    General: Skin is warm and dry.     Findings: No erythema or rash.  Neurological:     Mental Status: She is alert and oriented to person, place, and time.     Cranial Nerves: No cranial nerve deficit.     Motor: No abnormal muscle tone.     Coordination: Coordination normal.     Deep Tendon Reflexes: Reflexes are normal and symmetric. Reflexes normal.  Psychiatric:        Behavior: Behavior normal.        Thought Content: Thought content normal.        Judgment: Judgment normal.           Assessment & Plan:  Well exam. We discussed diet and exercise. Get fasting labs.  Alysia Penna, MD

## 2019-06-17 LAB — VITAMIN D 25 HYDROXY (VIT D DEFICIENCY, FRACTURES): VITD: 37.15 ng/mL (ref 30.00–100.00)

## 2019-06-17 LAB — TSH: TSH: 1.12 u[IU]/mL (ref 0.35–4.50)

## 2019-06-24 ENCOUNTER — Telehealth: Payer: Self-pay

## 2019-06-24 NOTE — Telephone Encounter (Signed)
Copied from [place] [id number]. Topic: General - Inquiry >> Jun 22, 2019  2:17 PM Mathis Bud wrote: Reason for CRM: Patient is requesting a call back to go over lab work. Call back [phone]

## 2019-06-24 NOTE — Telephone Encounter (Signed)
This is already taking care of

## 2019-06-29 ENCOUNTER — Telehealth: Payer: Self-pay | Admitting: *Deleted

## 2019-06-29 NOTE — Telephone Encounter (Signed)
Reviewed lab results and physician's note with patient. Answered all questions at this time.

## 2019-12-21 ENCOUNTER — Other Ambulatory Visit: Payer: Self-pay

## 2019-12-21 ENCOUNTER — Ambulatory Visit: Payer: BC Managed Care – PPO | Admitting: Pulmonary Disease

## 2019-12-21 ENCOUNTER — Encounter: Payer: Self-pay | Admitting: Pulmonary Disease

## 2019-12-21 VITALS — BP 142/86 | HR 78 | Temp 98.0°F | Ht 66.5 in | Wt 274.6 lb

## 2019-12-21 DIAGNOSIS — G4733 Obstructive sleep apnea (adult) (pediatric): Secondary | ICD-10-CM

## 2019-12-21 MED ORDER — ZOLPIDEM TARTRATE ER 12.5 MG PO TBCR: 12.5000 mg | EXTENDED_RELEASE_TABLET | Freq: Every evening | ORAL | 1 refills | Status: DC | PRN
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
## 2019-12-21 NOTE — Patient Instructions (Signed)
Insomnia Possibility of significant obstructive sleep apnea  Trial with Ambien for insomnia We will obtain a home sleep study to ascertain presence of significant sleep apnea If sleep apnea is present-option of treatment will be CPAP therapy  We will see you in about 3 months  Call if Ambien does not seem to work well for you   Sleep Apnea Sleep apnea is a condition in which breathing pauses or becomes shallow during sleep. Episodes of sleep apnea usually last 10 seconds or longer, and they may occur as many as 20 times an hour. Sleep apnea disrupts your sleep and keeps your body from getting the rest that it needs. This condition can increase your risk of certain health problems, including:  Heart attack.  Stroke.  Obesity.  Diabetes.  Heart failure.  Irregular heartbeat. What are the causes? There are three kinds of sleep apnea:  Obstructive sleep apnea. This kind is caused by a blocked or collapsed airway.  Central sleep apnea. This kind happens when the part of the brain that controls breathing does not send the correct signals to the muscles that control breathing.  Mixed sleep apnea. This is a combination of obstructive and central sleep apnea. The most common cause of this condition is a collapsed or blocked airway. An airway can collapse or become blocked if:  Your throat muscles are abnormally relaxed.  Your tongue and tonsils are larger than normal.  You are overweight.  Your airway is smaller than normal. What increases the risk? You are more likely to develop this condition if you:  Are overweight.  Smoke.  Have a smaller than normal airway.  Are elderly.  Are female.  Drink alcohol.  Take sedatives or tranquilizers.  Have a family history of sleep apnea. What are the signs or symptoms? Symptoms of this condition include:  Trouble staying asleep.  Daytime sleepiness and tiredness.  Irritability.  Loud snoring.  Morning  headaches.  Trouble concentrating.  Forgetfulness.  Decreased interest in sex.  Unexplained sleepiness.  Mood swings.  Personality changes.  Feelings of depression.  Waking up often during the night to urinate.  Dry mouth.  Sore throat. How is this diagnosed? This condition may be diagnosed with:  A medical history.  A physical exam.  A series of tests that are done while you are sleeping (sleep study). These tests are usually done in a sleep lab, but they may also be done at home. How is this treated? Treatment for this condition aims to restore normal breathing and to ease symptoms during sleep. It may involve managing health issues that can affect breathing, such as high blood pressure or obesity. Treatment may include:  Sleeping on your side.  Using a decongestant if you have nasal congestion.  Avoiding the use of depressants, including alcohol, sedatives, and narcotics.  Losing weight if you are overweight.  Making changes to your diet.  Quitting smoking.  Using a device to open your airway while you sleep, such as: ? An oral appliance. This is a custom-made mouthpiece that shifts your lower jaw forward. ? A continuous positive airway pressure (CPAP) device. This device blows air through a mask when you breathe out (exhale). ? A nasal expiratory positive airway pressure (EPAP) device. This device has valves that you put into each nostril. ? A bi-level positive airway pressure (BPAP) device. This device blows air through a mask when you breathe in (inhale) and breathe out (exhale).  Having surgery if other treatments do not work. During  surgery, excess tissue is removed to create a wider airway. It is important to get treatment for sleep apnea. Without treatment, this condition can lead to:  High blood pressure.  Coronary artery disease.  In men, an inability to achieve or maintain an erection (impotence).  Reduced thinking abilities. Follow these  instructions at home: Lifestyle  Make any lifestyle changes that your health care provider recommends.  Eat a healthy, well-balanced diet.  Take steps to lose weight if you are overweight.  Avoid using depressants, including alcohol, sedatives, and narcotics.  Do not use any products that contain nicotine or tobacco, such as cigarettes, e-cigarettes, and chewing tobacco. If you need help quitting, ask your health care provider. General instructions  Take over-the-counter and prescription medicines only as told by your health care provider.  If you were given a device to open your airway while you sleep, use it only as told by your health care provider.  If you are having surgery, make sure to tell your health care provider you have sleep apnea. You may need to bring your device with you.  Keep all follow-up visits as told by your health care provider. This is important. Contact a health care provider if:  The device that you received to open your airway during sleep is uncomfortable or does not seem to be working.  Your symptoms do not improve.  Your symptoms get worse. Get help right away if:  You develop: ? Chest pain. ? Shortness of breath. ? Discomfort in your back, arms, or stomach.  You have: ? Trouble speaking. ? Weakness on one side of your body. ? Drooping in your face. These symptoms may represent a serious problem that is an emergency. Do not wait to see if the symptoms will go away. Get medical help right away. Call your local emergency services (911 in the [place] Do not drive yourself to the hospital. Summary  Sleep apnea is a condition in which breathing pauses or becomes shallow during sleep.  The most common cause is a collapsed or blocked airway.  The goal of treatment is to restore normal breathing and to ease symptoms during sleep. This information is not intended to replace advice given to you by your health care provider. Make sure you discuss any  questions you have with your health care provider. Document Revised: 01/20/2019 Document Reviewed: 03/31/2018 Elsevier Patient Education  [place].

## 2020-01-19 ENCOUNTER — Other Ambulatory Visit: Payer: Self-pay

## 2020-01-19 ENCOUNTER — Ambulatory Visit (INDEPENDENT_AMBULATORY_CARE_PROVIDER_SITE_OTHER): Payer: BC Managed Care – PPO | Admitting: Internal Medicine

## 2020-01-19 ENCOUNTER — Encounter: Payer: Self-pay | Admitting: Internal Medicine

## 2020-01-19 VITALS — BP 120/84 | HR 97 | Temp 97.3°F | Wt 276.7 lb

## 2020-01-19 DIAGNOSIS — D171 Benign lipomatous neoplasm of skin and subcutaneous tissue of trunk: Secondary | ICD-10-CM | POA: Diagnosis not present
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
## 2020-01-19 NOTE — Patient Instructions (Addendum)
-  Nice seeing you today!!  -Referral for general surgery today.  -F/u with Dr. Sarajane Jews as scheduled.

## 2020-01-21 HISTORY — PX: COLONOSCOPY: SHX174

## 2020-01-26 ENCOUNTER — Telehealth: Payer: Self-pay | Admitting: Pulmonary Disease

## 2020-01-26 NOTE — Telephone Encounter (Signed)
I called pt to make sure it was ok to send last ov note to [facility]. She verified that I could send it. Notes faxed. Nothing further is needed.

## 2020-02-15 ENCOUNTER — Telehealth: Payer: Self-pay | Admitting: Pulmonary Disease

## 2020-02-15 NOTE — Telephone Encounter (Signed)
I have spoke with the patient and she is scheduled to pick up HST machine on 07/01 @ 11:00am and she is aware

## 2020-02-15 NOTE — Telephone Encounter (Signed)
I have this order and will call today

## 2020-02-17 ENCOUNTER — Other Ambulatory Visit: Payer: Self-pay

## 2020-02-17 ENCOUNTER — Ambulatory Visit: Payer: BC Managed Care – PPO

## 2020-02-17 DIAGNOSIS — G4733 Obstructive sleep apnea (adult) (pediatric): Secondary | ICD-10-CM | POA: Diagnosis not present

## 2020-02-18 ENCOUNTER — Other Ambulatory Visit: Payer: Self-pay | Admitting: Surgery

## 2020-02-21 ENCOUNTER — Telehealth: Payer: Self-pay | Admitting: Pulmonary Disease

## 2020-02-21 DIAGNOSIS — G4733 Obstructive sleep apnea (adult) (pediatric): Secondary | ICD-10-CM | POA: Diagnosis not present

## 2020-02-21 NOTE — Telephone Encounter (Signed)
Call patient  Sleep study result  Date of study: 02/17/2020  Impression: Moderate obstructive sleep apnea  Recommendation: Recommend CPAP therapy for moderate obstructive sleep apnea Auto titrating CPAP with pressure settings of 5-15 will be appropriate Weight loss measures encouraged Caution against driving when sleepy

## 2020-02-22 NOTE — Telephone Encounter (Signed)
Patient contacted with results of home sleep study. Patient is willing to start CPAP therapy. DME orders placed and follow up recall present.  [name] has reviewed the home sleep test this showed moderate obstructive sleep apnea and mild oxygen desaturation.   Recommendations   Treatment options are CPAP with the settings auto 5 to 15 cm H2O.    Weight loss measures encouraged .   Advise against driving while sleepy & against medication with sedative side effects.    Make appointment for 3 months for compliance with download with [name].

## 2020-03-02 ENCOUNTER — Other Ambulatory Visit: Payer: Self-pay | Admitting: Pulmonary Disease

## 2020-03-02 NOTE — Telephone Encounter (Signed)
Please refill Ambien for this paitient. Last refill 12/21/2019, last OV 12/21/2019.

## 2020-03-22 ENCOUNTER — Ambulatory Visit: Payer: BC Managed Care – PPO | Admitting: Family Medicine

## 2020-03-22 ENCOUNTER — Other Ambulatory Visit: Payer: Self-pay

## 2020-03-22 ENCOUNTER — Encounter: Payer: Self-pay | Admitting: Family Medicine

## 2020-03-22 VITALS — BP 130/80 | HR 94 | Temp 98.3°F | Wt 281.6 lb

## 2020-03-22 DIAGNOSIS — F321 Major depressive disorder, single episode, moderate: Secondary | ICD-10-CM

## 2020-03-22 MED ORDER — BUPROPION HCL ER (XL) 150 MG PO TB24: 150.0000 mg | ORAL_TABLET | Freq: Every day | ORAL | 2 refills | Status: DC
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
## 2020-03-31 ENCOUNTER — Telehealth: Payer: Self-pay | Admitting: Pulmonary Disease

## 2020-03-31 NOTE — Telephone Encounter (Signed)
Referral for new CPAP sent 02/22/2020, DME [facility]. Patient has not heard from them. Please advise how triage can assist.

## 2020-03-31 NOTE — Telephone Encounter (Addendum)
Order was sent to [facility] on 7/7.  I called [facility] & spoke to [place].  She states it is documented they called pt & left vm with no response.  She states Tracey in cpap scheduling will call pt again today before she leaves.  I spoke to pt & made her aware & gave her [facility]'s ph #.  She states she will call them.  Nothing further needed.

## 2020-04-13 ENCOUNTER — Other Ambulatory Visit: Payer: Self-pay | Admitting: Family Medicine

## 2020-04-13 ENCOUNTER — Other Ambulatory Visit: Payer: Self-pay | Admitting: Surgery

## 2020-04-17 ENCOUNTER — Telehealth: Payer: Self-pay | Admitting: Family Medicine

## 2020-04-17 NOTE — Telephone Encounter (Signed)
Pt would like to know if she can increase wellbutrin medication  due to having more anxiety lately.Please advise.

## 2020-04-18 MED ORDER — BUPROPION HCL ER (XL) 300 MG PO TB24
300.0000 mg | ORAL_TABLET | Freq: Every day | ORAL | 5 refills | Status: DC
Start: 2020-04-18 — End: 2020-04-21

## 2020-04-18 NOTE — Telephone Encounter (Signed)
Yes, increase the Wellbutrin XL to 300 mg daily, call in #[phone] with 5 rf

## 2020-04-18 NOTE — Telephone Encounter (Signed)
Rx sent in. Spoke with the patient. She is aware of the medication changes and that a new rx has been sent to the pharmacy.

## 2020-04-20 ENCOUNTER — Other Ambulatory Visit: Payer: Self-pay

## 2020-04-21 ENCOUNTER — Encounter: Payer: Self-pay | Admitting: Family Medicine

## 2020-04-21 ENCOUNTER — Ambulatory Visit: Payer: BC Managed Care – PPO | Admitting: Family Medicine

## 2020-04-21 VITALS — BP 130/80 | HR 90 | Temp 98.3°F | Wt 281.4 lb

## 2020-04-21 DIAGNOSIS — F418 Other specified anxiety disorders: Secondary | ICD-10-CM | POA: Diagnosis not present

## 2020-04-21 DIAGNOSIS — Z23 Encounter for immunization: Secondary | ICD-10-CM | POA: Diagnosis not present

## 2020-04-21 MED ORDER — BUPROPION HCL ER (XL) 300 MG PO TB24
300.0000 mg | ORAL_TABLET | Freq: Every day | ORAL | 5 refills | Status: DC
Start: 2020-04-21 — End: 2020-06-16

## 2020-04-21 NOTE — Progress Notes (Signed)
   Subjective:    Patient ID: Pamela Lopez, female    DOB: May 06, 1976, 44 y.o.   MRN: [id number]  HPI Here to follow up on depression and anxiety. She has been taking Wellbutrin XL 150 mg daily, and it has been helping. However she thinks she needs a higher dose and we have agreed to go up to 300 mg daily. She has no side effects to report.    Review of Systems  Constitutional: Negative.   Respiratory: Negative.   Cardiovascular: Negative.   Psychiatric/Behavioral: Positive for decreased concentration, dysphoric mood and sleep disturbance. Negative for agitation, behavioral problems, confusion, hallucinations, self-injury and suicidal ideas. The patient is nervous/anxious.        Objective:   Physical Exam Constitutional:      Appearance: Normal appearance.  Cardiovascular:     Rate and Rhythm: Normal rate and regular rhythm.     Pulses: Normal pulses.     Heart sounds: Normal heart sounds.  Pulmonary:     Effort: Pulmonary effort is normal.     Breath sounds: Normal breath sounds.  Neurological:     Mental Status: She is alert.  Psychiatric:        Mood and Affect: Mood normal.        Behavior: Behavior normal.        Thought Content: Thought content normal.        Judgment: Judgment normal.           Assessment & Plan:  Depression with anxiety, we will increase the Wellbutrin XL to 300 mg daily. She will report back in a few weeks.  Alysia Penna, MD

## 2020-05-09 ENCOUNTER — Encounter: Payer: Self-pay | Admitting: Adult Health

## 2020-05-09 ENCOUNTER — Ambulatory Visit: Payer: BC Managed Care – PPO | Admitting: Adult Health

## 2020-05-09 ENCOUNTER — Other Ambulatory Visit: Payer: Self-pay

## 2020-05-09 VITALS — BP 122/88 | HR 102 | Temp 98.5°F | Ht 66.5 in | Wt 286.6 lb

## 2020-05-09 DIAGNOSIS — G4733 Obstructive sleep apnea (adult) (pediatric): Secondary | ICD-10-CM | POA: Diagnosis not present

## 2020-05-09 DIAGNOSIS — E663 Overweight: Secondary | ICD-10-CM | POA: Diagnosis not present

## 2020-05-09 NOTE — Patient Instructions (Addendum)
May Try Dreamwear nasal mask.  Keep up good work.  Change CPAP pressure to 8 to 15cm H20 .  Saline nasal spray and gel As needed   Remain active  Do not drive if sleepy  Work on healthy weight.  Healthy sleep regimen  Ambien As needed  Insomnia .  Follow up with [name] in 4-6 months and As needed

## 2020-05-09 NOTE — Assessment & Plan Note (Signed)
Newly diagnosed moderate obstructive sleep apnea patient has excellent compliance and control on nocturnal CPAP.  She does have some discomfort with her current full facemask.  We went over several mask options she would like to proceed with a DreamWear nasal mask.  Will adjust CPAP pressure for comfort as sometimes she feels that the pressure is just not strong enough.  We will adjust to 8 to 15 cm H2O.  Tried a DreamWear nasal.  Patient also may use some saline nasal spray and saline nasal gel to see if this helps with intermittent nasal congestion. Patient may use Ambien as needed healthy sleep regimen discussed.  Advised not to use Ambien unless she is wearing her CPAP.  Plan  Patient Instructions  May Try Dreamwear nasal mask.  Keep up good work.  Change CPAP pressure to 8 to 15cm H20 .  Saline nasal spray and gel As needed   Remain active  Do not drive if sleepy  Work on healthy weight.  Healthy sleep regimen  Ambien As needed  Insomnia .  Follow up with [name] in 4-6 months and As needed
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
## 2020-05-09 NOTE — Assessment & Plan Note (Signed)
Healthy weight loss discussed 

## 2020-05-23 ENCOUNTER — Ambulatory Visit: Payer: BC Managed Care – PPO | Attending: Internal Medicine

## 2020-05-23 DIAGNOSIS — Z23 Encounter for immunization: Secondary | ICD-10-CM

## 2020-05-23 NOTE — Progress Notes (Signed)
   Covid-19 Vaccination Clinic  Name:  Pamela Lopez    MRN: [id number] DOB: Jul 19, 1976  05/23/2020  [name] was observed post Covid-19 immunization for 15 minutes without incident. She was provided with Vaccine Information Sheet and instruction to access the V-Safe system.   Ms. Donell Beers was instructed to call 911 with any severe reactions post vaccine: Marland Kitchen Difficulty breathing  . Swelling of face and throat  . A fast heartbeat  . A bad rash all over body  . Dizziness and weakness

## 2020-05-26 ENCOUNTER — Telehealth: Payer: Self-pay | Admitting: Family Medicine

## 2020-05-26 NOTE — Telephone Encounter (Signed)
Are you able to print the immunization report or does pt actually need a letter?

## 2020-05-26 NOTE — Telephone Encounter (Signed)
Patient needs a letter stating that she received her flu shot and what date she received her shot//cs  Please advise

## 2020-05-26 NOTE — Telephone Encounter (Signed)
Patient needs a letter stating that she got the flu shot and the date that she received it. She doesn't want the who immunization record to turn into her job.

## 2020-05-29 NOTE — Telephone Encounter (Signed)
Patient called and advised letter is available for pick up at the front desk.

## 2020-05-29 NOTE — Telephone Encounter (Signed)
The letter is perfect. I signed it. Thanks

## 2020-06-16 ENCOUNTER — Other Ambulatory Visit: Payer: Self-pay

## 2020-06-16 ENCOUNTER — Ambulatory Visit (INDEPENDENT_AMBULATORY_CARE_PROVIDER_SITE_OTHER): Payer: BC Managed Care – PPO | Admitting: Family Medicine

## 2020-06-16 ENCOUNTER — Encounter: Payer: Self-pay | Admitting: Family Medicine

## 2020-06-16 VITALS — BP 124/70 | HR 91 | Temp 98.7°F | Ht 66.5 in | Wt 287.4 lb

## 2020-06-16 DIAGNOSIS — Z0000 Encounter for general adult medical examination without abnormal findings: Secondary | ICD-10-CM | POA: Diagnosis not present

## 2020-06-16 MED ORDER — BUPROPION HCL ER (XL) 150 MG PO TB24: 150.0000 mg | ORAL_TABLET | Freq: Every day | ORAL | 3 refills | Status: DC

## 2020-06-16 MED ORDER — ALBUTEROL SULFATE HFA 108 (90 BASE) MCG/ACT IN AERS: 2.0000 | INHALATION_SPRAY | RESPIRATORY_TRACT | 5 refills | Status: DC | PRN

## 2020-06-16 MED ORDER — PROMETHAZINE HCL 25 MG PO TABS
ORAL_TABLET | ORAL | 3 refills | Status: DC
Start: 2020-06-16 — End: 2021-06-26

## 2020-06-16 MED ORDER — ESCITALOPRAM OXALATE 10 MG PO TABS
10.0000 mg | ORAL_TABLET | Freq: Every day | ORAL | 3 refills | Status: DC
Start: 2020-06-16 — End: 2020-07-17

## 2020-06-16 MED ORDER — MECLIZINE HCL 25 MG PO TABS: 25.0000 mg | ORAL_TABLET | ORAL | 5 refills | Status: DC | PRN

## 2020-06-16 NOTE — Progress Notes (Signed)
Subjective:    Patient ID: Pamela Lopez, female    DOB: 10/21/1975, 44 y.o.   MRN: [id number]  HPI Here for a well exam. She has a few issues to discuss. We started her on Wellbutrin in August for stressors she was dealing with and then we increased this to 300 mg daily. This has helped the depression somewhat but she thinks it is interfering with her sleep. She still takes Ambien CR at bedtime. Also her anxiety is still a problem. In addition she occasionally has days where she feels mild intermittent dizziness. No other neurologic problems.    Review of Systems  Constitutional: Negative.   HENT: Negative.   Eyes: Negative.   Respiratory: Negative.   Cardiovascular: Negative.   Gastrointestinal: Negative.   Genitourinary: Negative for decreased urine volume, difficulty urinating, dyspareunia, dysuria, enuresis, flank pain, frequency, hematuria, pelvic pain and urgency.  Musculoskeletal: Negative.   Skin: Negative.   Neurological: Positive for dizziness.  Psychiatric/Behavioral: Positive for dysphoric mood and sleep disturbance. The patient is nervous/anxious.        Objective:   Physical Exam Constitutional:      General: She is not in acute distress.    Appearance: She is well-developed. She is obese.  HENT:     Head: Normocephalic and atraumatic.     Right Ear: External ear normal.     Left Ear: External ear normal.     Nose: Nose normal.     Mouth/Throat:     Pharynx: No oropharyngeal exudate.  Eyes:     General: No scleral icterus.    Conjunctiva/sclera: Conjunctivae normal.     Pupils: Pupils are equal, round, and reactive to light.  Neck:     Thyroid: No thyromegaly.     Vascular: No JVD.  Cardiovascular:     Rate and Rhythm: Normal rate and regular rhythm.     Heart sounds: Normal heart sounds. No murmur heard.  No friction rub. No gallop.   Pulmonary:     Effort: Pulmonary effort is normal. No respiratory distress.     Breath sounds: Normal breath  sounds. No wheezing or rales.  Chest:     Chest wall: No tenderness.  Abdominal:     General: Bowel sounds are normal. There is no distension.     Palpations: Abdomen is soft. There is no mass.     Tenderness: There is no abdominal tenderness. There is no guarding or rebound.  Musculoskeletal:        General: No tenderness. Normal range of motion.     Cervical back: Normal range of motion and neck supple.  Lymphadenopathy:     Cervical: No cervical adenopathy.  Skin:    General: Skin is warm and dry.     Findings: No erythema or rash.  Neurological:     Mental Status: She is alert and oriented to person, place, and time.     Cranial Nerves: No cranial nerve deficit.     Motor: No abnormal muscle tone.     Coordination: Coordination normal.     Deep Tendon Reflexes: Reflexes are normal and symmetric. Reflexes normal.  Psychiatric:        Behavior: Behavior normal.        Thought Content: Thought content normal.        Judgment: Judgment normal.     Comments: Depressed affect            Assessment & Plan:  Well exam. We discussed diet and  exercise. Get fasting labs. For the depression and anxiety, we will decrease the Wellbutrin XL back to 150 mg a day, and we will add Lexapro 10 mg daily. Also for dizziness, try Meclizine 25 mg as needed.  Alysia Penna, MD

## 2020-06-16 NOTE — Addendum Note (Signed)
Addended by: Marrion Coy on: 06/16/2020 08:44 AM   Modules accepted: Orders

## 2020-06-17 LAB — HEPATIC FUNCTION PANEL
AG Ratio: 1.3 (calc) (ref 1.0–2.5)
ALT: 12 U/L (ref 6–29)
AST: 15 U/L (ref 10–30)
Albumin: 4 g/dL (ref 3.6–5.1)
Alkaline phosphatase (APISO): 48 U/L (ref 31–125)
Bilirubin, Direct: 0.1 mg/dL (ref 0.0–0.2)
Globulin: 3.1 g/dL (calc) (ref 1.9–3.7)
Indirect Bilirubin: 0.3 mg/dL (calc) (ref 0.2–1.2)
Total Bilirubin: 0.4 mg/dL (ref 0.2–1.2)
Total Protein: 7.1 g/dL (ref 6.1–8.1)

## 2020-06-17 LAB — LIPID PANEL
Cholesterol: 173 mg/dL (ref <200)
HDL: 62 mg/dL (ref >=50)
LDL Cholesterol (Calc): 96 mg/dL (calc)
Non-HDL Cholesterol (Calc): 111 mg/dL (calc) (ref <130)
Total CHOL/HDL Ratio: 2.8 (calc) (ref <5.0)
Triglycerides: 63 mg/dL (ref <150)

## 2020-06-17 LAB — BASIC METABOLIC PANEL
BUN: 14 mg/dL (ref 7–25)
CO2: 25 mmol/L (ref 20–32)
Calcium: 9.3 mg/dL (ref 8.6–10.2)
Chloride: 105 mmol/L (ref 98–110)
Creat: 0.96 mg/dL (ref 0.50–1.10)
Glucose, Bld: 89 mg/dL (ref 65–99)
Potassium: 4.4 mmol/L (ref 3.5–5.3)
Sodium: 140 mmol/L (ref 135–146)

## 2020-06-17 LAB — CBC WITH DIFFERENTIAL/PLATELET
Absolute Monocytes: 320 cells/uL (ref 200–950)
Basophils Absolute: 20 cells/uL (ref 0–200)
Basophils Relative: 0.5 %
Eosinophils Absolute: 60 cells/uL (ref 15–500)
Eosinophils Relative: 1.5 %
HCT: 39.5 % (ref 35.0–45.0)
Hemoglobin: 12.8 g/dL (ref 11.7–15.5)
Lymphs Abs: 1624 cells/uL (ref 850–3900)
MCH: 28.1 pg (ref 27.0–33.0)
MCHC: 32.4 g/dL (ref 32.0–36.0)
MCV: 86.8 fL (ref 80.0–100.0)
MPV: 11.3 fL (ref 7.5–12.5)
Monocytes Relative: 8 %
Neutro Abs: 1976 cells/uL (ref 1500–7800)
Neutrophils Relative %: 49.4 %
Platelets: 260 10*3/uL (ref 140–400)
RBC: 4.55 10*6/uL (ref 3.80–5.10)
RDW: 12.8 % (ref 11.0–15.0)
Total Lymphocyte: 40.6 %
WBC: 4 10*3/uL (ref 3.8–10.8)

## 2020-06-17 LAB — TSH: TSH: 1.33 mIU/L

## 2020-06-25 ENCOUNTER — Other Ambulatory Visit: Payer: Self-pay | Admitting: Family Medicine

## 2020-06-27 ENCOUNTER — Telehealth: Payer: Self-pay | Admitting: Family Medicine

## 2020-06-27 NOTE — Telephone Encounter (Signed)
Patient is calling and wanted to see if her lab results were back from her physical, please advise. [phone]

## 2020-06-27 NOTE — Telephone Encounter (Signed)
See result note.  

## 2020-07-05 ENCOUNTER — Telehealth: Payer: Self-pay | Admitting: Family Medicine

## 2020-07-05 NOTE — Telephone Encounter (Signed)
Pt call and stated she have stop taken the Lexapro because it made her to sick.

## 2020-07-05 NOTE — Telephone Encounter (Signed)
Spoke with patient. Patient reports she was nauseous and  had loose stool especially first thing in the morning after she took Lexapro. Patient reports since she stopped taking sx have went away. Asked patient if she wanted Dr.Fry to Rx an alternative medication. Patient stated "no I will be okay"

## 2020-07-07 NOTE — Telephone Encounter (Signed)
Noted  

## 2020-07-17 ENCOUNTER — Encounter: Payer: Self-pay | Admitting: Family Medicine

## 2020-07-17 ENCOUNTER — Telehealth (INDEPENDENT_AMBULATORY_CARE_PROVIDER_SITE_OTHER): Payer: BC Managed Care – PPO | Admitting: Family Medicine

## 2020-07-17 DIAGNOSIS — J019 Acute sinusitis, unspecified: Secondary | ICD-10-CM

## 2020-07-17 MED ORDER — AZITHROMYCIN 250 MG PO TABS: ORAL_TABLET | ORAL | 0 refills | Status: DC
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
## 2020-07-29 IMAGING — US US ABDOMEN LIMITED
1 series · 14 of 25 positions shown · non-contrast
Comparison: 02/16/2016 ultrasound and prior studies

CLINICAL DATA: 42-year-old female with RIGHT UPPER quadrant
abdominal pain, weight loss and nausea/diarrhea.

EXAM:
ULTRASOUND ABDOMEN LIMITED RIGHT UPPER QUADRANT

[Series 1: us abdomen limited · 0.19mm/px · 14 of 48 slices shown]
[im 1/48]
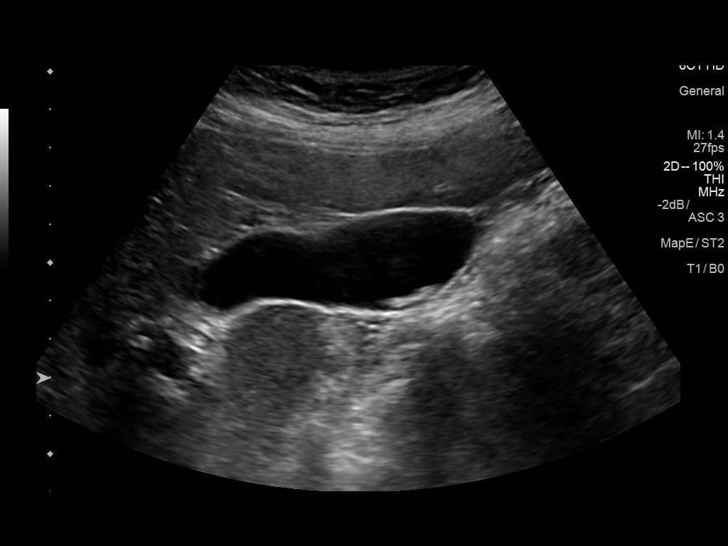
[im 4/48]
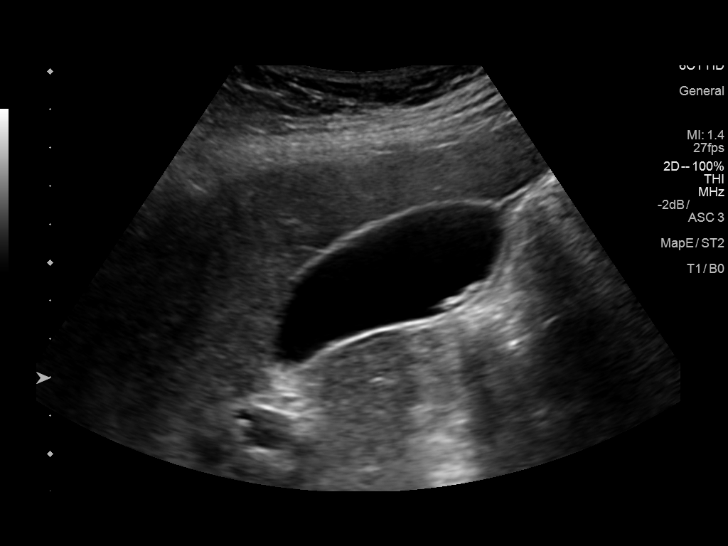
[im 8/48]
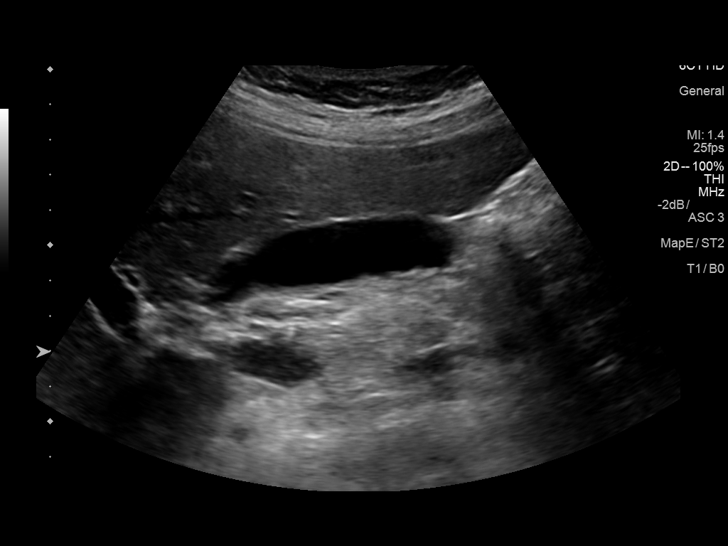
[im 12/48]
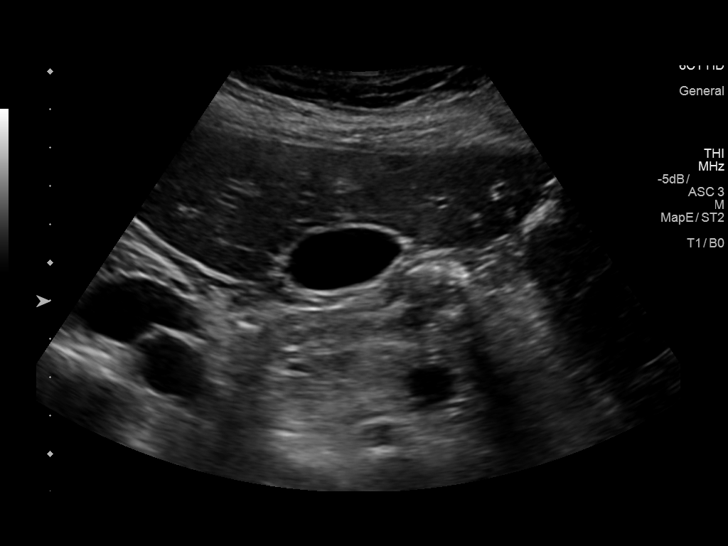
[im 16/48]
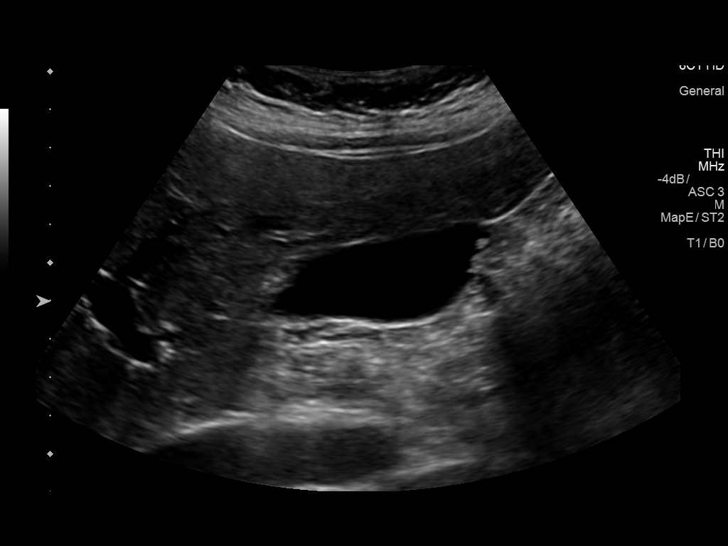
[im 18/48]
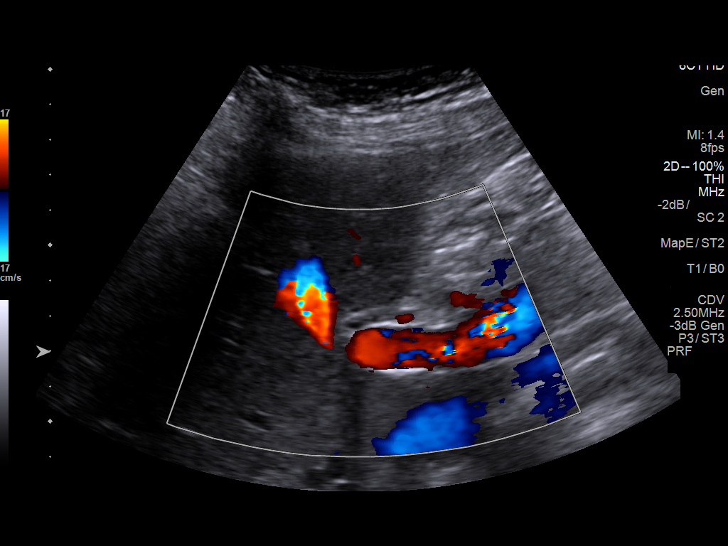
[im 22/48]
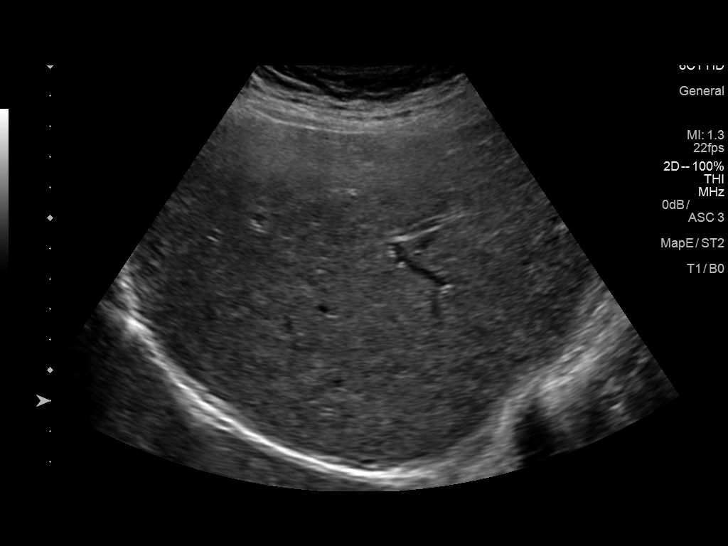
[im 26/48]
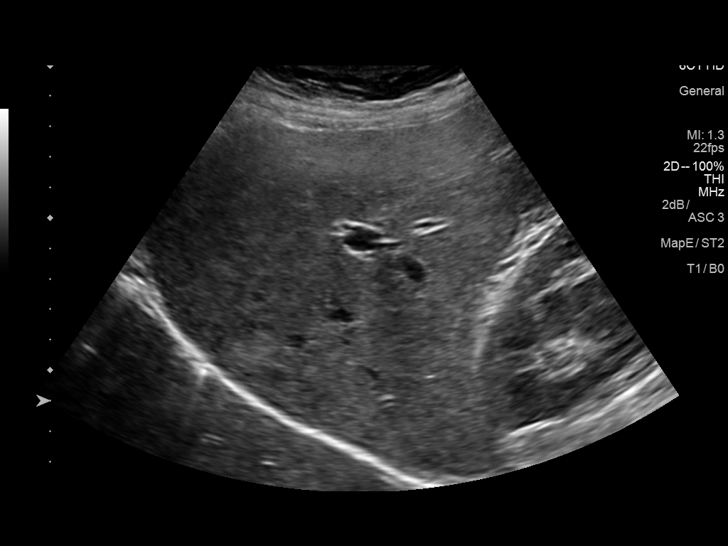
[im 30/48]
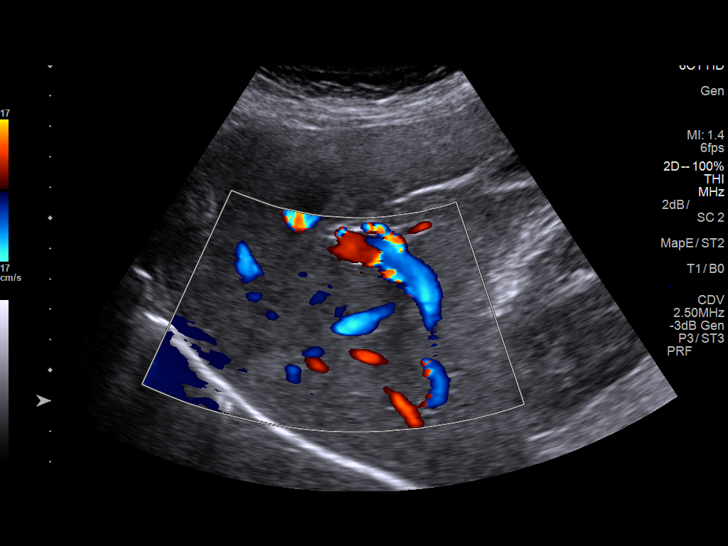
[im 32/48]
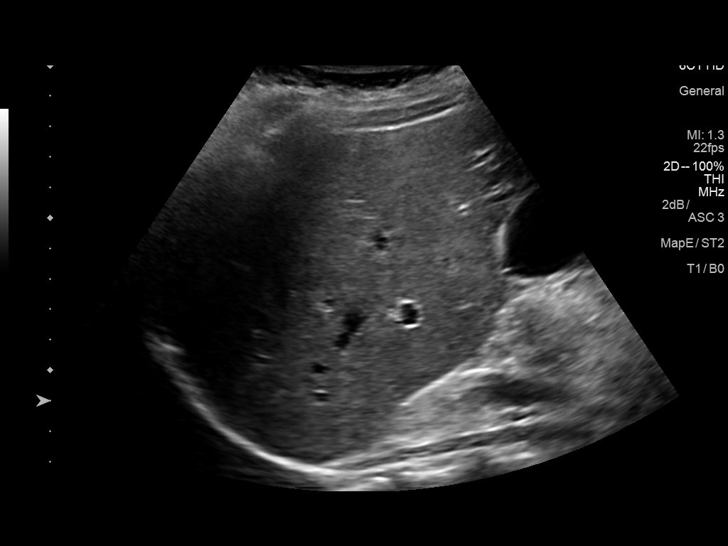
[im 36/48]
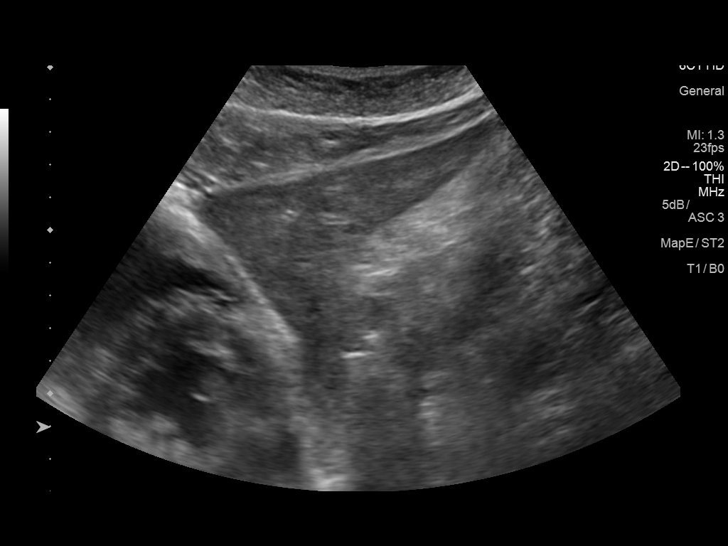
[im 40/48]
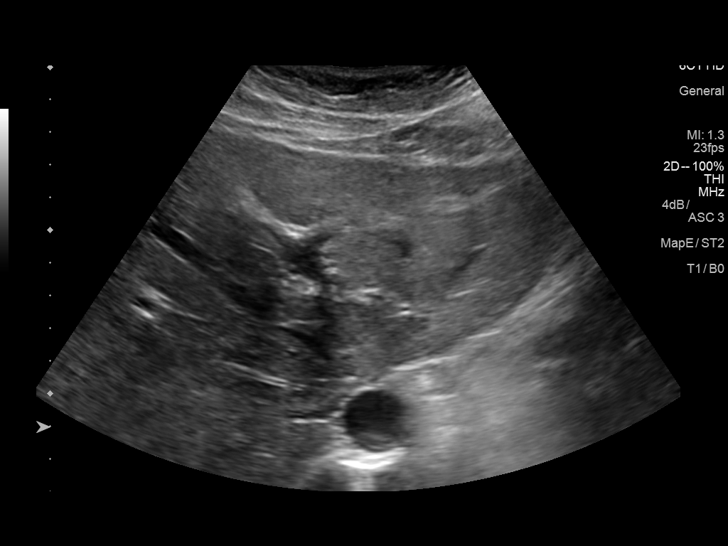
[im 44/48]
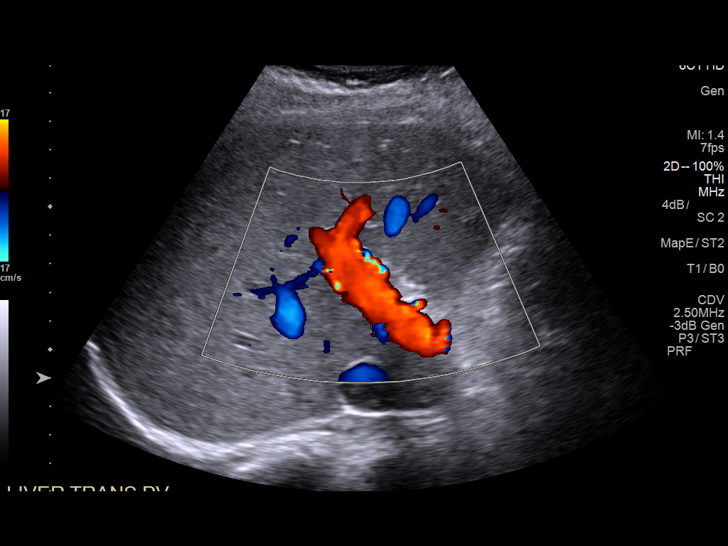
[im 48/48]
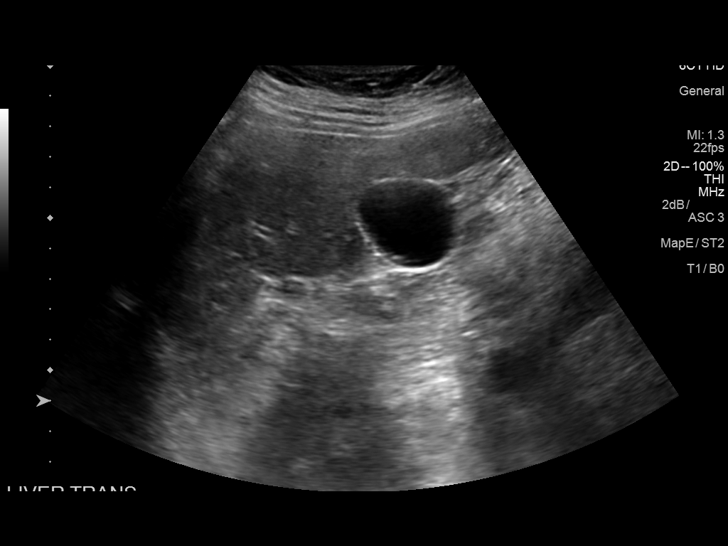

[14 of 25 positions shown; findings below may reference images not displayed]

FINDINGS: Gallbladder:

A few small mobile gallstones are identified, the largest measuring
5 mm. There is no evidence of gallbladder wall thickening,
pericholecystic fluid or sonographic Murphy sign.

Common bile duct:

Diameter: 3 mm.  No intrahepatic or extrahepatic biliary dilatation.

Liver:

A stable 2.2 cm hyperechoic mass in the RIGHT liver is unchanged
from 4995 and compatible with a benign lesion/meningioma. No new
focal hepatic abnormalities are identified. Hepatic echogenicity is
normal. Portal vein is patent on color Doppler imaging with normal
direction of blood flow towards the liver.
IMPRESSION: 1. Cholelithiasis without evidence of acute cholecystitis or biliary
dilatation.
2. No significant hepatic abnormalities. Stable RIGHT hepatic
hemangioma.

## 2020-08-10 ENCOUNTER — Telehealth: Payer: Self-pay | Admitting: Pulmonary Disease

## 2020-08-10 DIAGNOSIS — G4733 Obstructive sleep apnea (adult) (pediatric): Secondary | ICD-10-CM

## 2020-08-10 NOTE — Telephone Encounter (Signed)
Order sent to [facility] for [employer] Nasal mask . Patient aware .

## 2020-08-24 ENCOUNTER — Telehealth: Payer: Self-pay | Admitting: Family Medicine

## 2020-08-24 NOTE — Telephone Encounter (Signed)
Pt is calling in wanting to know what she needs to do to discontinue Rx bupropion (WELLBUTRIN XL) 150 MG.  Pt would like to have a call back.

## 2020-08-25 NOTE — Telephone Encounter (Signed)
Patient is aware 

## 2020-08-25 NOTE — Telephone Encounter (Signed)
Take it every other day for 2 weeks, and then stop it

## 2020-10-10 ENCOUNTER — Other Ambulatory Visit: Payer: Self-pay

## 2020-10-10 ENCOUNTER — Ambulatory Visit: Payer: BC Managed Care – PPO | Admitting: Pulmonary Disease

## 2020-10-10 ENCOUNTER — Encounter: Payer: Self-pay | Admitting: Pulmonary Disease

## 2020-10-10 VITALS — BP 128/78 | HR 82 | Temp 97.6°F | Ht 66.5 in | Wt 282.0 lb

## 2020-10-10 DIAGNOSIS — G4733 Obstructive sleep apnea (adult) (pediatric): Secondary | ICD-10-CM

## 2020-10-10 MED ORDER — ZOLPIDEM TARTRATE ER 12.5 MG PO TBCR
12.5000 mg | EXTENDED_RELEASE_TABLET | Freq: Every evening | ORAL | 5 refills | Status: DC | PRN
Start: 2020-10-10 — End: 2021-04-20
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
## 2020-10-10 NOTE — Patient Instructions (Addendum)
Will confirm mask prescription with [facility]-nasal mask  Prescription for Ambien sent into pharmacy   Follow-up in 4 to 5 months

## 2020-11-07 ENCOUNTER — Other Ambulatory Visit: Payer: Self-pay

## 2020-11-08 ENCOUNTER — Ambulatory Visit: Payer: BC Managed Care – PPO | Admitting: Family Medicine

## 2020-11-08 ENCOUNTER — Ambulatory Visit (INDEPENDENT_AMBULATORY_CARE_PROVIDER_SITE_OTHER): Payer: BC Managed Care – PPO

## 2020-11-08 ENCOUNTER — Encounter: Payer: Self-pay | Admitting: Family Medicine

## 2020-11-08 VITALS — BP 130/86 | HR 80 | Temp 98.4°F | Wt 283.0 lb

## 2020-11-08 DIAGNOSIS — M546 Pain in thoracic spine: Secondary | ICD-10-CM | POA: Diagnosis not present

## 2020-11-08 MED ORDER — DICLOFENAC SODIUM 75 MG PO TBEC: 75.0000 mg | DELAYED_RELEASE_TABLET | Freq: Two times a day (BID) | ORAL | 2 refills | Status: DC

## 2020-11-08 MED ORDER — CYCLOBENZAPRINE HCL 10 MG PO TABS: 10.0000 mg | ORAL_TABLET | Freq: Three times a day (TID) | ORAL | 2 refills | Status: DC | PRN

## 2020-11-08 NOTE — Progress Notes (Signed)
   Subjective:    Patient ID: Pamela Lopez, female    DOB: Oct 13, 1975, 45 y.o.   MRN: [id number]  HPI Here for 6 weeks of pain and spasms in the right middle back. This started as she was getting into a car, and she felt immediate pains int his area. It has waxed and waned since then. She has taken nothing for it. She has tried stretches at home.    Review of Systems  Constitutional: Negative.   Respiratory: Negative.   Cardiovascular: Negative.   Musculoskeletal: Positive for back pain.       Objective:   Physical Exam Constitutional:      Appearance: Normal appearance.  Cardiovascular:     Rate and Rhythm: Normal rate and regular rhythm.     Pulses: Normal pulses.     Heart sounds: Normal heart sounds.  Pulmonary:     Effort: Pulmonary effort is normal.     Breath sounds: Normal breath sounds.  Musculoskeletal:     Comments: There is obvious spasm and tenderness in the right middle back. ROM is preserved.   Neurological:     Mental Status: She is alert.           Assessment & Plan:  Thoracic back pain. She will apply moist heat. Try Diclofenac and Flexeril. Get Xrays of the thoracic spine.  Alysia Penna, MD

## 2020-11-09 ENCOUNTER — Telehealth: Payer: Self-pay | Admitting: Pulmonary Disease

## 2020-11-09 DIAGNOSIS — G4733 Obstructive sleep apnea (adult) (pediatric): Secondary | ICD-10-CM

## 2020-11-09 NOTE — Telephone Encounter (Signed)
Called pt and no answer- LMTCB

## 2020-11-10 NOTE — Telephone Encounter (Signed)
Called and spoke with pt letting her know the info stated by Aaron Edelman and that we were going to place Rx to have cpap settings changed. Pt verbalized understanding. Also have scheduled pt a f/u in 4 weeks for further eval of CPAP. Nothing further needed.

## 2020-11-10 NOTE — Telephone Encounter (Signed)
Pt returning call from office . Please advise   Call back [phone]

## 2020-11-10 NOTE — Telephone Encounter (Signed)
11/10/2020  CPAP download compliance report provided and reviewed:  10/11/2020-11/09/2020-27 of last 30 days used, 27 of those days greater than 4 hours, average usage 7 hours and 57 minutes, APAP setting 8-15, 95th percentile 14, maximum 14.7, AHI 5  No significant leaks over the last week or 2.  Based off information the patient is reporting.  Believe is reasonable to increase patient's APAP settings to: 8-18.  Please make this adjustment.  Please also schedule patient for follow-up for further review in our office in 4 weeks with Dr. Or APP.  Wyn Quaker FNP

## 2020-11-10 NOTE — Telephone Encounter (Signed)
Called and spoke with pt who is requesting to have settings increased either by 1-2.Marland Kitchen Pt stated that she got a new nasal mask and due to this, she feels like the settings is not enough.  Download has been printed for review of pt's data.  With Dr. Jenetta Downer not being in office today and with TP being in [place], giving download to provider of the day for review.  Aaron Edelman, please advise.

## 2020-11-16 ENCOUNTER — Telehealth: Payer: Self-pay | Admitting: Family Medicine

## 2020-11-16 ENCOUNTER — Telehealth: Payer: Self-pay | Admitting: Pulmonary Disease

## 2020-11-16 NOTE — Telephone Encounter (Signed)
Called and spoke patient. She stated that she called [facility] to check on the settings change. They stated that they had not received the order. Spoke with AO about this via [employer], he is ok with me changing the settings via [place].   Called patient back to let her know I had changed the settings based on the order. She verbalized understanding.  Nothing further needed at time of call.

## 2020-11-16 NOTE — Telephone Encounter (Signed)
Patient called to let Dr. Sarajane Jews know that the medication that you prescribed for her is helping. Her back has improved a lot and her pain level is 4-5 after a week of working.  She wants to try and work for another week before considering PT.

## 2020-11-16 NOTE — Telephone Encounter (Signed)
FYI

## 2020-11-23 ENCOUNTER — Telehealth: Payer: Self-pay | Admitting: Family Medicine

## 2020-11-23 DIAGNOSIS — G8929 Other chronic pain: Secondary | ICD-10-CM

## 2020-11-23 NOTE — Telephone Encounter (Signed)
Requesting physical therapy referral. Last office visit- 11/08/2020

## 2020-11-23 NOTE — Telephone Encounter (Signed)
The referral was done  

## 2020-11-23 NOTE — Telephone Encounter (Signed)
Patient states she is starting to have issues with her back again.  She says they previously spoke about her going to physical therapy and she would like him to put in a referral..  Please advise.

## 2020-11-23 NOTE — Telephone Encounter (Signed)
Called patient to inform of referral has been placed

## 2020-11-27 ENCOUNTER — Telehealth: Payer: Self-pay | Admitting: Pulmonary Disease

## 2020-11-27 NOTE — Telephone Encounter (Signed)
Spoke with patient, she states she returned the CPAP machine this morning because of billing issues.  She is going through a hardship right now and cannot have unexpected and unexplained billing.  She also could not get anyone from [place] to call her back.  She was coming back in because she changed her mask, but now she does not have the CPAP machine and wants to know if she still needs to come in next week for the appointment.  I explained that she still has sleep apnea and now it is not being treated and that needs to be addressed as that is dangerous.  Advised I will get this information to [name] and then one of [place] will call her back.  She verbalized understanding.  Nothing further needed.  Called Duncan with [employer] regarding patient, reached VM.  Left VM with patients name and dob and information regarding the issue and requested a return call.  [name], Please advise if patient needs to keep f/u appointment with you next week since she has returned her CPAP machine.  I have placed a call to Ashly with [employer] to see if we can sort this out for the patient.  Thank you.

## 2020-11-27 NOTE — Telephone Encounter (Signed)
Received a return call from Ashly with [employer], he stated that the rental period ended for this patient and the CPAP is hers.  He did verify with Estill Bamberg that she did drop off her CPAP machine and she tried to explain that the machine was her's to keep and that she was being charged for masks and tubing/filters only.  He also stated that 1 month prior the patients are sent a letter explaining to them what will occur at the end of the rental period and that if they do not respond, they will continue to receive supplies as they have been and if they do not want that to continue, they need to call and cancel the supplies.  He said they can choose to get their supplies from an online CPAP supply company which may be cheaper for them.  He provided the number for central billing [phone] so that she can ask specific questions about what she has been charged.  According to [facility], she owes $25.  Leatrice Jewels stated that she can come and pick up her machine, all she has to do is call when she arrives and they will bring it out and she can sign for it.  I called the patient and explained that when she called previously she likely was speaking to someone in a call center and not the local branch, I was advised by Ashly to have them choose option 1 when they call so they talk to someone in the local branch.  I let her know all the information that was provided to me by Ashly about picking up the CPAP machine she dropped off this morning.  I also provided the billing # so she can get her questions answered.  I advised her to keep her f/u with [name] and to keep wearing her CPAP machine to treat her sleep apnea otherwise she may have complications including heart problems.  She was very Patent attorney.  Nothing further needed.

## 2020-12-04 ENCOUNTER — Ambulatory Visit: Payer: BC Managed Care – PPO | Admitting: Primary Care

## 2020-12-04 ENCOUNTER — Other Ambulatory Visit: Payer: Self-pay

## 2020-12-04 ENCOUNTER — Encounter: Payer: Self-pay | Admitting: Primary Care

## 2020-12-04 VITALS — BP 120/80 | HR 76 | Temp 98.0°F | Ht 66.5 in | Wt 286.0 lb

## 2020-12-04 DIAGNOSIS — G4733 Obstructive sleep apnea (adult) (pediatric): Secondary | ICD-10-CM

## 2020-12-04 NOTE — Patient Instructions (Signed)
Nice seeing you today Pamela Lopez  Glad the new CPAP mask is working well for you Continue to wear CPAP every night for min 4-6 hours or longer Do not drive if experiencing excessive daytime fatigue or somnolence   Orders: Adjust CPAP pressure 10-18cm h20  Follow-up 6 months with Dr. Leretha Pol
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
## 2020-12-04 NOTE — Assessment & Plan Note (Signed)
-   Home sleep study in July 2021 showed moderate OSA>> AHI 17.7/hr - Patient is 90% compliant with CPAP use.  Tolerating new nasal face mask.  Feels she is at times not getting enough pressure. Current CPAP pressure 8 to 18 cm H2O with residual AHI 2.8 - Adjust CPAP pressure to 10 to 18 cm H2O - Continue to encourage weight loss efforts, she will consider referral to healthy weight and wellness - FU in 6 months or sooner if needed

## 2020-12-06 ENCOUNTER — Encounter: Payer: Self-pay | Admitting: Physical Therapy

## 2020-12-06 ENCOUNTER — Other Ambulatory Visit: Payer: Self-pay

## 2020-12-06 ENCOUNTER — Ambulatory Visit: Payer: BC Managed Care – PPO | Attending: Family Medicine | Admitting: Physical Therapy

## 2020-12-06 DIAGNOSIS — R293 Abnormal posture: Secondary | ICD-10-CM | POA: Insufficient documentation

## 2020-12-06 DIAGNOSIS — R252 Cramp and spasm: Secondary | ICD-10-CM | POA: Diagnosis present

## 2020-12-06 DIAGNOSIS — M546 Pain in thoracic spine: Secondary | ICD-10-CM | POA: Insufficient documentation

## 2020-12-06 NOTE — Patient Instructions (Addendum)
Trigger Point Dry Needling  . What is Trigger Point Dry Needling (DN)? o DN is a physical therapy technique used to treat muscle pain and dysfunction. Specifically, DN helps deactivate muscle trigger points (muscle knots).  o A thin filiform needle is used to penetrate the skin and stimulate the underlying trigger point. The goal is for a local twitch response (LTR) to occur and for the trigger point to relax. No medication of any kind is injected during the procedure.   . What Does Trigger Point Dry Needling Feel Like?  o The procedure feels different for each individual patient. Some patients report that they do not actually feel the needle enter the skin and overall the process is not painful. Very mild bleeding may occur. However, many patients feel a deep cramping in the muscle in which the needle was inserted. This is the local twitch response.   Marland Kitchen How Will I feel after the treatment? o Soreness is normal, and the onset of soreness may not occur for a few hours. Typically this soreness does not last longer than two days.  o Bruising is uncommon, however; ice can be used to decrease any possible bruising.  o In rare cases feeling tired or nauseous after the treatment is normal. In addition, your symptoms may get worse before they get better, this period will typically not last longer than 24 hours.   . What Can I do After My Treatment? o Increase your hydration by drinking more water for the next 24 hours. o You may place ice or heat on the areas treated that have become sore, however, do not use heat on inflamed or bruised areas. Heat often brings more relief post needling. o You can continue your regular activities, but vigorous activity is not recommended initially after the treatment for 24 hours. o DN is best combined with other physical therapy such as strengthening, stretching, and other therapies.   Access Code: [email or web address] URL: [email or web address] Date:  12/06/2020 Prepared by: Everardo All  Exercises Hook Lying Single Knee to Chest Stretch with Towel - 2 x daily - 7 x weekly - 1 sets - 3 reps - 10s hold Seated Shoulder Diagonal Pulls with Resistance - 1 x daily - 7 x weekly - 2 sets - 10 reps Supine Lower Trunk Rotation - 2 x daily - 7 x weekly - 1 sets - 10 reps - 3s hold
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
## 2020-12-12 ENCOUNTER — Encounter: Payer: Self-pay | Admitting: Physical Therapy

## 2020-12-12 ENCOUNTER — Ambulatory Visit: Payer: BC Managed Care – PPO | Admitting: Physical Therapy

## 2020-12-12 ENCOUNTER — Other Ambulatory Visit: Payer: Self-pay

## 2020-12-12 DIAGNOSIS — M546 Pain in thoracic spine: Secondary | ICD-10-CM

## 2020-12-12 DIAGNOSIS — R293 Abnormal posture: Secondary | ICD-10-CM

## 2020-12-12 DIAGNOSIS — R252 Cramp and spasm: Secondary | ICD-10-CM | POA: Diagnosis not present

## 2020-12-12 NOTE — Patient Instructions (Signed)
Prone Scapular Retraction and Row - 1 x daily - 7 x weekly - 3 sets - 10 reps Sidelying Shoulder External Rotation AROM - 1 x daily - 7 x weekly - 3 sets - 10 reps Sidelying Open Book Thoracic Lumbar Rotation and Extension - 1 x daily - 7 x weekly - 1 sets - 3 reps - 10s hold
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
## 2020-12-20 ENCOUNTER — Ambulatory Visit: Payer: BC Managed Care – PPO | Attending: Family Medicine | Admitting: Physical Therapy

## 2020-12-20 ENCOUNTER — Encounter: Payer: Self-pay | Admitting: Physical Therapy

## 2020-12-20 ENCOUNTER — Other Ambulatory Visit: Payer: Self-pay

## 2020-12-20 DIAGNOSIS — M546 Pain in thoracic spine: Secondary | ICD-10-CM | POA: Insufficient documentation

## 2020-12-20 DIAGNOSIS — R252 Cramp and spasm: Secondary | ICD-10-CM | POA: Insufficient documentation

## 2020-12-20 DIAGNOSIS — R293 Abnormal posture: Secondary | ICD-10-CM | POA: Insufficient documentation

## 2020-12-20 NOTE — Patient Instructions (Signed)
Latissimus Dorsi Stretch at Wall - 1 x daily - 7 x weekly - 1 sets - 2 reps - 15s hold Supine Single Arm Shoulder Protraction - 1 x daily - 7 x weekly - 2 sets - 12 reps
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
## 2020-12-27 ENCOUNTER — Telehealth: Payer: Self-pay | Admitting: Family Medicine

## 2020-12-27 DIAGNOSIS — M546 Pain in thoracic spine: Secondary | ICD-10-CM

## 2020-12-27 DIAGNOSIS — G8929 Other chronic pain: Secondary | ICD-10-CM

## 2020-12-27 NOTE — Telephone Encounter (Signed)
Pt notified through [place] regarding referral

## 2020-12-27 NOTE — Telephone Encounter (Signed)
Patient is calling and wanted to see if provider could put a referral to see an orthopedic for back pain, please advise. CB is [phone]

## 2020-12-27 NOTE — Telephone Encounter (Signed)
Last office visit- 11/08/2020

## 2020-12-27 NOTE — Telephone Encounter (Signed)
The referral was done  

## 2020-12-28 ENCOUNTER — Other Ambulatory Visit: Payer: Self-pay

## 2020-12-28 ENCOUNTER — Ambulatory Visit: Payer: BC Managed Care – PPO | Admitting: Physical Therapy

## 2020-12-28 DIAGNOSIS — M546 Pain in thoracic spine: Secondary | ICD-10-CM

## 2020-12-28 DIAGNOSIS — R252 Cramp and spasm: Secondary | ICD-10-CM

## 2020-12-28 DIAGNOSIS — R293 Abnormal posture: Secondary | ICD-10-CM
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
## 2021-01-02 ENCOUNTER — Ambulatory Visit: Payer: BC Managed Care – PPO | Admitting: Physical Therapy

## 2021-01-02 ENCOUNTER — Other Ambulatory Visit: Payer: Self-pay

## 2021-01-02 DIAGNOSIS — M546 Pain in thoracic spine: Secondary | ICD-10-CM

## 2021-01-02 DIAGNOSIS — R293 Abnormal posture: Secondary | ICD-10-CM

## 2021-01-02 DIAGNOSIS — R252 Cramp and spasm: Secondary | ICD-10-CM
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
## 2021-01-09 ENCOUNTER — Ambulatory Visit: Payer: BC Managed Care – PPO | Admitting: Physical Therapy

## 2021-01-09 ENCOUNTER — Other Ambulatory Visit: Payer: Self-pay

## 2021-01-09 DIAGNOSIS — R252 Cramp and spasm: Secondary | ICD-10-CM

## 2021-01-09 DIAGNOSIS — R293 Abnormal posture: Secondary | ICD-10-CM

## 2021-01-09 DIAGNOSIS — M546 Pain in thoracic spine: Secondary | ICD-10-CM
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
## 2021-01-09 NOTE — Patient Instructions (Signed)
Baby Eagle - 2 x daily - 7 x weekly - 1 sets - 2 reps - 20s hold

## 2021-01-22 ENCOUNTER — Encounter: Payer: Self-pay | Admitting: Family Medicine

## 2021-01-22 ENCOUNTER — Telehealth: Payer: BC Managed Care – PPO | Admitting: Family Medicine

## 2021-01-22 ENCOUNTER — Other Ambulatory Visit: Payer: Self-pay | Admitting: Family Medicine

## 2021-01-22 VITALS — Wt 286.0 lb

## 2021-01-22 DIAGNOSIS — J019 Acute sinusitis, unspecified: Secondary | ICD-10-CM

## 2021-01-22 MED ORDER — FLUCONAZOLE 150 MG PO TABS: 150.0000 mg | ORAL_TABLET | Freq: Once | ORAL | 11 refills | Status: AC

## 2021-01-22 MED ORDER — AZITHROMYCIN 250 MG PO TABS: ORAL_TABLET | ORAL | 0 refills | Status: DC

## 2021-01-22 MED ORDER — HYDROCODONE BIT-HOMATROP MBR 5-1.5 MG/5ML PO SOLN: 5.0000 mL | ORAL | 0 refills | Status: DC | PRN

## 2021-01-22 MED ORDER — ALBUTEROL SULFATE HFA 108 (90 BASE) MCG/ACT IN AERS: 2.0000 | INHALATION_SPRAY | RESPIRATORY_TRACT | 11 refills | Status: DC | PRN
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
## 2021-01-22 NOTE — Telephone Encounter (Signed)
Please get her a work note

## 2021-01-23 ENCOUNTER — Encounter: Payer: BC Managed Care – PPO | Admitting: Physical Therapy

## 2021-01-23 NOTE — Telephone Encounter (Signed)
Pharmacy request change from Albuterol inhaler to Levalbuterol inhaler.

## 2021-01-24 NOTE — Telephone Encounter (Signed)
Pt work note sent to [employer] per pt request

## 2021-01-26 ENCOUNTER — Telehealth: Payer: Self-pay | Admitting: Pulmonary Disease

## 2021-01-26 ENCOUNTER — Other Ambulatory Visit: Payer: Self-pay

## 2021-01-26 ENCOUNTER — Telehealth: Payer: Self-pay | Admitting: Family Medicine

## 2021-01-26 MED ORDER — AZITHROMYCIN 250 MG PO TABS: ORAL_TABLET | ORAL | 0 refills | Status: DC

## 2021-01-26 NOTE — Telephone Encounter (Signed)
Last office visit- 01/22/21

## 2021-01-26 NOTE — Telephone Encounter (Signed)
Called and spoke with patient, she states she went to her doctor and she has a sinus infection and right now the pressure from the CPAP is too much with her congestion in her head and her ears.  Advised I would make [name] aware and once we hear back from him we will call her back with his recommendations.  She verbalized understanding.  [name], Patient is requesting that pressure be lowered while she has a sinus infection.  Please advise.  Thank you.

## 2021-01-26 NOTE — Telephone Encounter (Signed)
Pt is calling in stating the she would like to see if she can get a zpac due to her symptoms (head congestion and discharge from her eyes) has not improved.  Pt declined to make an appointment.  Pharm:  [facility] [address] [facility].

## 2021-01-26 NOTE — Telephone Encounter (Signed)
[facility] [address]  Called patient to make aware.

## 2021-01-26 NOTE — Telephone Encounter (Signed)
Call in a Zpack  ?

## 2021-01-30 ENCOUNTER — Other Ambulatory Visit: Payer: Self-pay

## 2021-01-30 ENCOUNTER — Ambulatory Visit: Payer: BC Managed Care – PPO | Attending: Family Medicine | Admitting: Physical Therapy

## 2021-01-30 DIAGNOSIS — R252 Cramp and spasm: Secondary | ICD-10-CM | POA: Insufficient documentation

## 2021-01-30 DIAGNOSIS — R293 Abnormal posture: Secondary | ICD-10-CM

## 2021-01-30 DIAGNOSIS — M546 Pain in thoracic spine: Secondary | ICD-10-CM | POA: Insufficient documentation

## 2021-01-30 NOTE — Patient Instructions (Signed)
Standing Lat Pull Down with Resistance - Elbows Bent - 1 x daily - 7 x weekly - 3 sets - 12 reps Seated Thoracic Lumbar Extension - 1 x daily - 7 x weekly - 1 sets - 3 reps - 10s hold Standing Row with Anchored Resistance - 1 x daily - 7 x weekly - 3 sets - 12 reps  Education for proper entry/exit for her car.  Education for use of thoracolumbar roll when driving.
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
## 2021-02-01 NOTE — Telephone Encounter (Signed)
She is on an auto titrating machine  If not able to use the machine at present, she should hold off until she feels better- her sinus pressure feels better

## 2021-02-01 NOTE — Telephone Encounter (Signed)
Spoke with the pt and notified of response per Dr. Olalere  She verbalized understanding  Nothing further needed 

## 2021-04-20 ENCOUNTER — Other Ambulatory Visit: Payer: Self-pay

## 2021-04-20 ENCOUNTER — Ambulatory Visit: Payer: BC Managed Care – PPO | Admitting: Podiatry

## 2021-04-20 ENCOUNTER — Encounter: Payer: Self-pay | Admitting: Podiatry

## 2021-04-20 ENCOUNTER — Ambulatory Visit (INDEPENDENT_AMBULATORY_CARE_PROVIDER_SITE_OTHER): Payer: BC Managed Care – PPO

## 2021-04-20 DIAGNOSIS — M722 Plantar fascial fibromatosis: Secondary | ICD-10-CM | POA: Diagnosis not present

## 2021-04-20 MED ORDER — DICLOFENAC SODIUM 75 MG PO TBEC: 75.0000 mg | DELAYED_RELEASE_TABLET | Freq: Two times a day (BID) | ORAL | 0 refills | Status: DC

## 2021-04-20 MED ORDER — TRIAMCINOLONE ACETONIDE 10 MG/ML IJ SUSP
20.0000 mg | Freq: Once | INTRAMUSCULAR | Status: AC
  Administered 2021-04-20: 20 mg

## 2021-04-20 NOTE — Patient Instructions (Signed)

## 2021-04-23 NOTE — Progress Notes (Signed)
Subjective:   Patient ID: Pamela Lopez, female   DOB: 45 y.o.   MRN: [id number]   HPI Patient presents with heel pain of approximate 1 month duration left over right with both of them bothering her and states that long-term she has flatfeet and may need treatment for this.  Patient states that there is been some swelling and also some numbness and tingling and she has been on her feet more.  Patient does not smoke likes to be active   Review of Systems  All other systems reviewed and are negative.      Objective:  Physical Exam Vitals and nursing note reviewed.  Constitutional:      Appearance: She is well-developed.  Pulmonary:     Effort: Pulmonary effort is normal.  Musculoskeletal:        General: Normal range of motion.  Skin:    General: Skin is warm.  Neurological:     Mental Status: She is alert.    Neurovascular status was found to be intact muscle strength was found to be adequate range of motion adequate with significant diminishment of arch height bilateral with inflammation pain of the medial band of the plantar fascial bilateral with moderate obesity is complicating factor.  Patient does have good digital perfusion is well oriented x3 with moderate equinus     Assessment:  To plantar fasciitis bilateral with inflammation fluid of the medial band along with moderate structural abnormality in patient who works a weightbearing job long hours cement floors     Plan:  H&P reviewed condition and at this point went ahead and x-rayed and reviewed.  I did sterile prep and injected the plantar fascial bilateral 3 mg Kenalog 5 mg Xylocaine and applied fascial braces bilateral and casted for functional orthotic devices bilateral.  Patient will be seen back to recheck is encouraged to utilize support and also anti-inflammatory diclofenac 75 mg twice daily written today  X-rays indicate small spur no indications of stress fracture depression of the arch noted upon  weightbearing x-rays

## 2021-05-10 ENCOUNTER — Other Ambulatory Visit: Payer: Self-pay

## 2021-05-10 ENCOUNTER — Telehealth (INDEPENDENT_AMBULATORY_CARE_PROVIDER_SITE_OTHER): Payer: BC Managed Care – PPO | Admitting: Internal Medicine

## 2021-05-10 DIAGNOSIS — J0191 Acute recurrent sinusitis, unspecified: Secondary | ICD-10-CM

## 2021-05-10 DIAGNOSIS — Z79899 Other long term (current) drug therapy: Secondary | ICD-10-CM

## 2021-05-10 MED ORDER — FLUCONAZOLE 150 MG PO TABS: 150.0000 mg | ORAL_TABLET | Freq: Once | ORAL | 0 refills | Status: AC

## 2021-05-10 MED ORDER — CEFDINIR 300 MG PO CAPS: 300.0000 mg | ORAL_CAPSULE | Freq: Two times a day (BID) | ORAL | 0 refills | Status: DC
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
## 2021-05-17 ENCOUNTER — Other Ambulatory Visit: Payer: Self-pay | Admitting: Family Medicine

## 2021-05-30 ENCOUNTER — Ambulatory Visit (INDEPENDENT_AMBULATORY_CARE_PROVIDER_SITE_OTHER): Payer: BC Managed Care – PPO | Admitting: *Deleted

## 2021-05-30 ENCOUNTER — Other Ambulatory Visit: Payer: Self-pay

## 2021-05-30 DIAGNOSIS — M722 Plantar fascial fibromatosis: Secondary | ICD-10-CM

## 2021-05-30 NOTE — Progress Notes (Signed)
Patient presents today to pick up custom molded foot orthotics, diagnosed with plantar fasciitis by Dr. Paulla Dolly.   Orthotics were dispensed and fit was satisfactory. Reviewed instructions for break-in and wear. Written instructions given to patient.  Patient will follow up as needed.   [facility] - order # [id number]

## 2021-06-06 ENCOUNTER — Telehealth: Payer: Self-pay | Admitting: *Deleted

## 2021-06-06 NOTE — Telephone Encounter (Signed)
Patient is calling because pain is starting to resurface, after B/ l injections on 09/22. Please contact to schedule.

## 2021-06-07 ENCOUNTER — Other Ambulatory Visit: Payer: Self-pay

## 2021-06-07 ENCOUNTER — Encounter: Payer: Self-pay | Admitting: Podiatry

## 2021-06-07 ENCOUNTER — Ambulatory Visit: Payer: BC Managed Care – PPO | Admitting: Podiatry

## 2021-06-07 DIAGNOSIS — M722 Plantar fascial fibromatosis: Secondary | ICD-10-CM | POA: Diagnosis not present

## 2021-06-07 MED ORDER — TRIAMCINOLONE ACETONIDE 10 MG/ML IJ SUSP
20.0000 mg | Freq: Once | INTRAMUSCULAR | Status: AC
  Administered 2021-06-07: 20 mg

## 2021-06-07 NOTE — Telephone Encounter (Signed)
Patient has been scheduled for 06/07/21@1 :15,confirmed with patient.

## 2021-06-07 NOTE — Progress Notes (Signed)
Subjective:   Patient ID: Pamela Lopez, female   DOB: 45 y.o.   MRN: [id number]   HPI Patient presents stating that both her heels are hurting again and she just picked her orthotics up last week.  Unfortunately her father has just passed away and she has to be on her feet more and she is having trouble standing   ROS      Objective:  Physical Exam  Neurovascular status intact with inflammation pain of the plantar fascial bilateral at the insertional point tendon calcaneus with flatfoot deformity bilateral     Assessment:  Acute plantar fasciitis bilateral with inflammation fluid buildup     Plan:  Discussed orthotics and their importance and she will begin wearing them and went ahead today did sterile prep and injected the plantar fascial bilateral 3 mg Kenalog 5 mg Xylocaine and advised on support shoes and reappoint 6 weeks or earlier if needed

## 2021-06-21 ENCOUNTER — Encounter: Payer: BC Managed Care – PPO | Admitting: Family Medicine

## 2021-06-26 ENCOUNTER — Encounter: Payer: Self-pay | Admitting: Family Medicine

## 2021-06-26 ENCOUNTER — Other Ambulatory Visit: Payer: Self-pay

## 2021-06-26 ENCOUNTER — Ambulatory Visit (INDEPENDENT_AMBULATORY_CARE_PROVIDER_SITE_OTHER): Payer: BC Managed Care – PPO | Admitting: Family Medicine

## 2021-06-26 VITALS — BP 120/80 | HR 73 | Temp 98.6°F | Ht 66.5 in | Wt 289.0 lb

## 2021-06-26 DIAGNOSIS — Z0000 Encounter for general adult medical examination without abnormal findings: Secondary | ICD-10-CM

## 2021-06-26 DIAGNOSIS — Z23 Encounter for immunization: Secondary | ICD-10-CM

## 2021-06-26 LAB — CBC WITH DIFFERENTIAL/PLATELET
Basophils Absolute: 0 10*3/uL (ref 0.0–0.1)
Basophils Relative: 0.6 % (ref 0.0–3.0)
Eosinophils Absolute: 0.1 10*3/uL (ref 0.0–0.7)
Eosinophils Relative: 1.3 % (ref 0.0–5.0)
HCT: 37.6 % (ref 36.0–46.0)
Hemoglobin: 12.3 g/dL (ref 12.0–15.0)
Lymphocytes Relative: 29.2 % (ref 12.0–46.0)
Lymphs Abs: 1.6 10*3/uL (ref 0.7–4.0)
MCHC: 32.7 g/dL (ref 30.0–36.0)
MCV: 86.5 fl (ref 78.0–100.0)
Monocytes Absolute: 0.4 10*3/uL (ref 0.1–1.0)
Monocytes Relative: 7.4 % (ref 3.0–12.0)
Neutro Abs: 3.4 10*3/uL (ref 1.4–7.7)
Neutrophils Relative %: 61.5 % (ref 43.0–77.0)
Platelets: 217 10*3/uL (ref 150.0–400.0)
RBC: 4.35 Mil/uL (ref 3.87–5.11)
RDW: 13.7 % (ref 11.5–15.5)
WBC: 5.5 10*3/uL (ref 4.0–10.5)

## 2021-06-26 LAB — BASIC METABOLIC PANEL
BUN: 12 mg/dL (ref 6–23)
CO2: 28 mEq/L (ref 19–32)
Calcium: 9.3 mg/dL (ref 8.4–10.5)
Chloride: 102 mEq/L (ref 96–112)
Creatinine, Ser: 0.77 mg/dL (ref 0.40–1.20)
GFR: 93.97 mL/min (ref >60.00)
Glucose, Bld: 86 mg/dL (ref 70–99)
Potassium: 3.8 mEq/L (ref 3.5–5.1)
Sodium: 138 mEq/L (ref 135–145)

## 2021-06-26 LAB — HEPATIC FUNCTION PANEL
ALT: 11 U/L (ref 0–35)
AST: 16 U/L (ref 0–37)
Albumin: 4.2 g/dL (ref 3.5–5.2)
Alkaline Phosphatase: 42 U/L (ref 39–117)
Bilirubin, Direct: 0 mg/dL (ref 0.0–0.3)
Total Bilirubin: 0.4 mg/dL (ref 0.2–1.2)
Total Protein: 7.5 g/dL (ref 6.0–8.3)

## 2021-06-26 LAB — LIPID PANEL
Cholesterol: 161 mg/dL (ref 0–200)
HDL: 61.7 mg/dL (ref >39.00)
LDL Cholesterol: 79 mg/dL (ref 0–99)
NonHDL: 99.36
Total CHOL/HDL Ratio: 3
Triglycerides: 103 mg/dL (ref 0.0–149.0)
VLDL: 20.6 mg/dL (ref 0.0–40.0)

## 2021-06-26 LAB — HEMOGLOBIN A1C: Hgb A1c MFr Bld: 6 % (ref 4.6–6.5)

## 2021-06-26 LAB — TSH: TSH: 1.05 u[IU]/mL (ref 0.35–5.50)

## 2021-06-26 MED ORDER — LEVALBUTEROL TARTRATE 45 MCG/ACT IN AERO: 2.0000 | INHALATION_SPRAY | RESPIRATORY_TRACT | 11 refills | Status: DC | PRN

## 2021-06-26 MED ORDER — PROMETHAZINE HCL 25 MG PO TABS: ORAL_TABLET | ORAL | 3 refills | Status: DC

## 2021-06-26 NOTE — Progress Notes (Signed)
   Subjective:    Patient ID: Pamela Lopez, female    DOB: 06-19-1976, 45 y.o.   MRN: [id number]  HPI Here for a well exam. She is doing well.    Review of Systems  Constitutional: Negative.   HENT: Negative.    Eyes: Negative.   Respiratory: Negative.    Cardiovascular: Negative.   Gastrointestinal: Negative.   Genitourinary:  Negative for decreased urine volume, difficulty urinating, dyspareunia, dysuria, enuresis, flank pain, frequency, hematuria, pelvic pain and urgency.  Musculoskeletal: Negative.   Skin: Negative.   Neurological: Negative.  Negative for headaches.  Psychiatric/Behavioral: Negative.        Objective:   Physical Exam Constitutional:      General: She is not in acute distress.    Appearance: She is well-developed. She is obese.  HENT:     Head: Normocephalic and atraumatic.     Right Ear: External ear normal.     Left Ear: External ear normal.     Nose: Nose normal.     Mouth/Throat:     Pharynx: No oropharyngeal exudate.  Eyes:     General: No scleral icterus.    Conjunctiva/sclera: Conjunctivae normal.     Pupils: Pupils are equal, round, and reactive to light.  Neck:     Thyroid: No thyromegaly.     Vascular: No JVD.  Cardiovascular:     Rate and Rhythm: Normal rate and regular rhythm.     Heart sounds: Normal heart sounds. No murmur heard.   No friction rub. No gallop.  Pulmonary:     Effort: Pulmonary effort is normal. No respiratory distress.     Breath sounds: Normal breath sounds. No wheezing or rales.  Chest:     Chest wall: No tenderness.  Abdominal:     General: Bowel sounds are normal. There is no distension.     Palpations: Abdomen is soft. There is no mass.     Tenderness: There is no abdominal tenderness. There is no guarding or rebound.  Musculoskeletal:        General: No tenderness. Normal range of motion.     Cervical back: Normal range of motion and neck supple.  Lymphadenopathy:     Cervical: No cervical adenopathy.   Skin:    General: Skin is warm and dry.     Findings: No erythema or rash.  Neurological:     Mental Status: She is alert and oriented to person, place, and time.     Cranial Nerves: No cranial nerve deficit.     Motor: No abnormal muscle tone.     Coordination: Coordination normal.     Deep Tendon Reflexes: Reflexes are normal and symmetric. Reflexes normal.  Psychiatric:        Behavior: Behavior normal.        Thought Content: Thought content normal.        Judgment: Judgment normal.          Assessment & Plan:  Well exam. We dicussed diet and exercise. Get fasting labs.  Alysia Penna, MD

## 2021-06-26 NOTE — Addendum Note (Signed)
Addended by: Wyvonne Lenz on: 06/26/2021 02:18 PM   Modules accepted: Orders

## 2021-06-26 NOTE — Addendum Note (Signed)
Addended by: Rosalyn Gess D on: 06/26/2021 10:27 AM   Modules accepted: Orders

## 2021-06-27 ENCOUNTER — Telehealth: Payer: Self-pay | Admitting: *Deleted

## 2021-06-27 NOTE — Telephone Encounter (Signed)
Patient is calling to let the physician know that after wearing the Orthotics for 2 weeks , unable to wear,is causing more pain and uncomfortable in the right ankle area. She would like to know the next step moving forward. Please advise.

## 2021-06-28 NOTE — Telephone Encounter (Signed)
I'm seeing her next week

## 2021-06-28 NOTE — Telephone Encounter (Signed)
Returned the call to patient, no answer, left voice message per doctor's message for seeing her next week to discuss.

## 2021-07-02 ENCOUNTER — Encounter: Payer: Self-pay | Admitting: Podiatry

## 2021-07-02 ENCOUNTER — Ambulatory Visit (INDEPENDENT_AMBULATORY_CARE_PROVIDER_SITE_OTHER): Payer: BC Managed Care – PPO | Admitting: Podiatry

## 2021-07-02 ENCOUNTER — Other Ambulatory Visit: Payer: Self-pay

## 2021-07-02 DIAGNOSIS — M722 Plantar fascial fibromatosis: Secondary | ICD-10-CM | POA: Diagnosis not present

## 2021-07-02 DIAGNOSIS — M7751 Other enthesopathy of right foot: Secondary | ICD-10-CM

## 2021-07-02 MED ORDER — TRIAMCINOLONE ACETONIDE 10 MG/ML IJ SUSP
10.0000 mg | Freq: Once | INTRAMUSCULAR | Status: AC
Start: 2021-07-02 — End: 2021-07-02
  Administered 2021-07-02: 10 mg

## 2021-07-02 MED ORDER — TRIAMCINOLONE ACETONIDE 10 MG/ML IJ SUSP: 10.0000 mg | Freq: Once | INTRAMUSCULAR | Status: DC

## 2021-07-02 MED ORDER — PREDNISONE 10 MG PO TABS: ORAL_TABLET | ORAL | 0 refills | Status: DC

## 2021-07-02 NOTE — Progress Notes (Signed)
Subjective:   Patient ID: Pamela Lopez, female   DOB: 45 y.o.   MRN: [id number]   HPI Patient states she is not sure if she is having trouble with the orthotics or whether it is just related to changes from her heel but her ankle has started to hurt her a lot on the outside right and that her heels are still bothering her some but nowhere near to the same degree as previously   ROS      Objective:  Physical Exam  Acute sinus tarsitis right with inflammation along with moderate plantar fascial symptomatology bilateral     Assessment:  Pain in the sinus tarsi right with fluid buildup and mild discomfort when palpating the plantar fascial     Plan:  H&P reviewed both conditions and I did discuss [place] fasciitis the continuation of physical therapy anti-inflammatories and supportive shoes along with gradual increase in orthotics but she will back off on them.  For the sinus tarsi did do sterile prep and I injected the sinus tarsi right 3 mg Kenalog 5 mg Xylocaine and placed on a Sterapred DS Dosepak.  Reappoint 2 weeks to recheck

## 2021-07-09 IMAGING — DX DG THORACIC SPINE 2V
2 series · 2 of 2 positions shown · non-contrast
Comparison: 10/23/2014

CLINICAL DATA: Chest pain for several weeks

EXAM:
THORACIC SPINE 2 VIEWS

[thoracic spine ap]
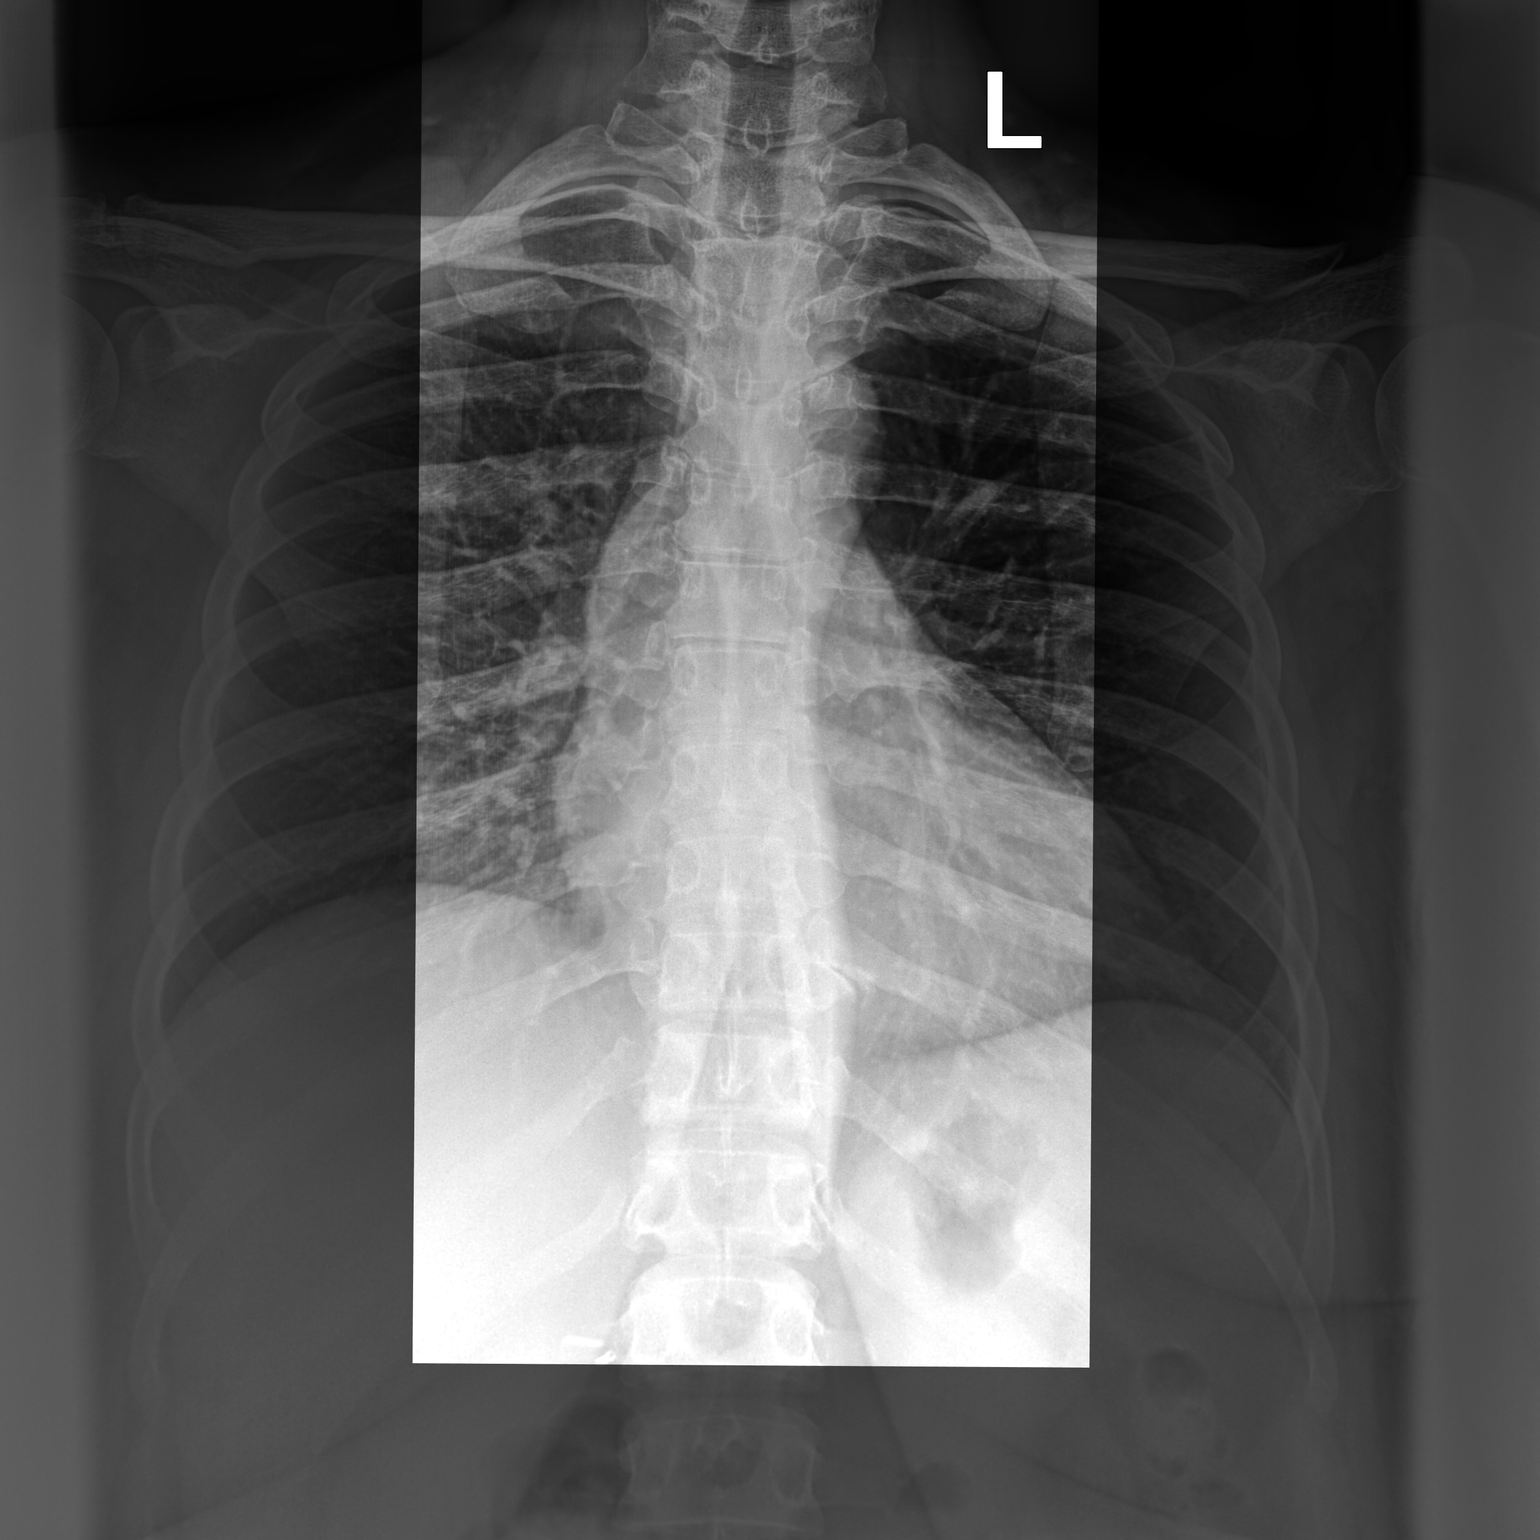

[thoracic spine lat]
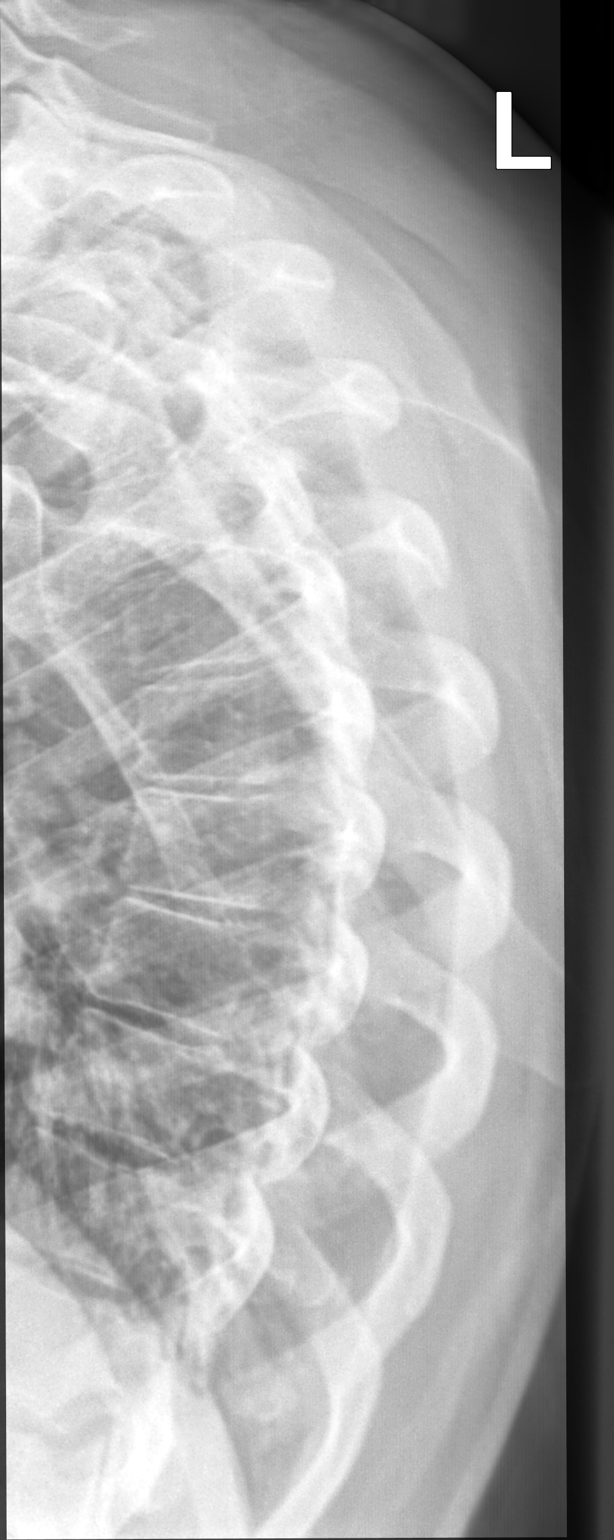

[2 of 2 positions shown; findings below may reference images not displayed]

FINDINGS: Vertebral body height is well maintained. Pedicles are within normal
limits. No paraspinal mass is seen. No rib abnormality is seen. No
other focal abnormality is noted.
IMPRESSION: No acute abnormality seen.

## 2021-07-20 ENCOUNTER — Ambulatory Visit: Payer: BC Managed Care – PPO | Admitting: Podiatry

## 2021-07-27 ENCOUNTER — Ambulatory Visit: Payer: BC Managed Care – PPO | Admitting: Podiatry

## 2021-08-02 ENCOUNTER — Encounter: Payer: Self-pay | Admitting: Podiatry

## 2021-08-02 ENCOUNTER — Ambulatory Visit (INDEPENDENT_AMBULATORY_CARE_PROVIDER_SITE_OTHER): Payer: BC Managed Care – PPO | Admitting: Podiatry

## 2021-08-02 ENCOUNTER — Other Ambulatory Visit: Payer: Self-pay

## 2021-08-02 DIAGNOSIS — M76821 Posterior tibial tendinitis, right leg: Secondary | ICD-10-CM

## 2021-08-02 DIAGNOSIS — M722 Plantar fascial fibromatosis: Secondary | ICD-10-CM

## 2021-08-02 MED ORDER — TRIAMCINOLONE ACETONIDE 10 MG/ML IJ SUSP
20.0000 mg | Freq: Once | INTRAMUSCULAR | Status: AC
Start: 2021-08-02 — End: 2021-08-02
  Administered 2021-08-02: 20 mg

## 2021-08-02 MED ORDER — PREDNISONE 10 MG PO TABS: ORAL_TABLET | ORAL | 0 refills | Status: DC

## 2021-08-04 NOTE — Progress Notes (Signed)
Subjective:   Patient ID: Pamela Lopez, female   DOB: 45 y.o.   MRN: [id number]   HPI Patient presents stating that her heels are still bothering her a lot and that she does have to work on cement floors with obesity is complicating factor   ROS      Objective:  Physical Exam  Neurovascular status intact with patient having significant flatfoot deformity excessive stress on her heels with moderate obesity and the fact she has to work on cement floors for relative extended hours standing     Assessment:  Continued acute plantar fasciitis bilateral with inflammation fluid buildup and flatfoot deformity with obesity is complicating factor     Plan:  H&P reviewed condition at great length.  I do think orthotics would be best but she is denying wanting those at the current time and I went ahead today and I did try again to treat the acute inflammation by doing sterile prep bilateral injected the plantar fascia 3 mg Kenalog 5 mg Xylocaine and I went ahead and I applied a night splint bilateral to try to support the arches and see whether or not this will solve the problem.  Due to her walking on cement floors flatfeet she may need orthotics but we will try this first and see the response

## 2021-08-30 ENCOUNTER — Other Ambulatory Visit: Payer: Self-pay

## 2021-08-30 ENCOUNTER — Encounter: Payer: Self-pay | Admitting: Podiatry

## 2021-08-30 ENCOUNTER — Ambulatory Visit: Payer: BC Managed Care – PPO | Admitting: Podiatry

## 2021-08-30 DIAGNOSIS — M722 Plantar fascial fibromatosis: Secondary | ICD-10-CM

## 2021-08-30 NOTE — Progress Notes (Signed)
Subjective:   Patient ID: Pamela Lopez, female   DOB: 46 y.o.   MRN: [id number]   HPI Patient states that she is starting to improve and has a significant reduction of pain with orthotics night splints and stretching along with shoe gear modification   ROS      Objective:  Physical Exam  Neurovascular status intact diminishment of discomfort plantar fascial bilateral inflammation still present upon deep palpation but overall much improved from previous     Assessment:  Improvement of acute plantar fascial inflammation heel region bilateral with obesity and cement floors is complicating factor      Plan:  H&P reviewed condition discussed continuation of orthotics ways to use her night splint in appropriate fashion and supportive shoe gear.  Patient will be seen back as needed encouraged to call with questions concerns which may arise

## 2021-09-20 ENCOUNTER — Encounter: Payer: Self-pay | Admitting: Pulmonary Disease

## 2021-09-20 ENCOUNTER — Ambulatory Visit: Payer: BC Managed Care – PPO | Admitting: Pulmonary Disease

## 2021-09-20 ENCOUNTER — Other Ambulatory Visit: Payer: Self-pay

## 2021-09-20 VITALS — BP 120/80 | HR 82 | Temp 98.6°F | Ht 66.5 in | Wt 294.0 lb

## 2021-09-20 DIAGNOSIS — Z9989 Dependence on other enabling machines and devices: Secondary | ICD-10-CM

## 2021-09-20 DIAGNOSIS — G4733 Obstructive sleep apnea (adult) (pediatric): Secondary | ICD-10-CM
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
## 2021-09-20 NOTE — Patient Instructions (Signed)
Continue using CPAP on a nightly basis  Weight loss efforts as best as you can  I will see you back in about 6 months  Call us with any significant concerns

## 2021-10-31 ENCOUNTER — Encounter: Payer: Self-pay | Admitting: Dermatology

## 2021-10-31 ENCOUNTER — Other Ambulatory Visit: Payer: Self-pay

## 2021-10-31 ENCOUNTER — Ambulatory Visit: Payer: BC Managed Care – PPO | Admitting: Dermatology

## 2021-10-31 DIAGNOSIS — L814 Other melanin hyperpigmentation: Secondary | ICD-10-CM

## 2021-10-31 DIAGNOSIS — B360 Pityriasis versicolor: Secondary | ICD-10-CM

## 2021-10-31 NOTE — Patient Instructions (Addendum)
OVER THE COUNTER CLOTRIMAZOLE - APPLY TO THE SPOT FOR 2-4 WKS  ? ?CALL OR MYCHART MESSAGE IF NO IMPROVEMENT  ? ?DR AMY MCMICHAEL - [facility]  ?

## 2021-11-12 ENCOUNTER — Encounter: Payer: Self-pay | Admitting: Dermatology

## 2021-11-12 NOTE — Progress Notes (Signed)
? ?  New Patient ?  ?Subjective  ?Pamela Lopez is a 46 y.o. female who presents for the following: Skin Problem (Lesions on stomach x 1.5 week. ). ? ?Discolored areas on neck and torso ?Location:  ?Duration:  ?Quality:  ?Associated Signs/Symptoms: ?Modifying Factors:  ?Severity:  ?Timing: ?Context:  ? ? ?The following portions of the chart were reviewed this encounter and updated as appropriate:  Tobacco  Allergies  Meds  Problems  Med Hx  Surg Hx  Fam Hx   ?  ? ?Objective  ?Well appearing patient in no apparent distress; mood and affect are within normal limits. ?Torso - Posterior (Back) ?1 to 3 cm patches with subtle branching scale and pink-brown dyschromia ? ?Neck - Anterior ?This may be a mix of mild acanthosis nigra cans plus some postinflammatory hyperpigmentation.  We discussed the limited number of options available.  Encouraged in the future to consider a one-time consultation with an expert in hyperpigmentation in individuals of color (Dr. Lois Huxley). ? ? ? ?All skin waist up examined.  Areas beneath undergarments not fully examined ? ? ?Assessment & Plan  ?Tinea versicolor ?Torso - Posterior (Back) ? ?OTC CLOTRIMAZOLE apply to and beyond patches daily after bathing for 1 month.  Told to expect pigment blending to take much longer. ? ?Other melanin hyperpigmentation ?Neck - Anterior ? ?DR. AMY MCMICHAEL - BAPTIST  ? ? ?

## 2021-11-15 ENCOUNTER — Ambulatory Visit: Payer: BC Managed Care – PPO | Admitting: Family Medicine

## 2021-11-15 ENCOUNTER — Encounter: Payer: Self-pay | Admitting: Family Medicine

## 2021-11-15 VITALS — BP 118/80 | HR 82 | Temp 98.6°F | Wt 303.0 lb

## 2021-11-15 DIAGNOSIS — J3089 Other allergic rhinitis: Secondary | ICD-10-CM

## 2021-11-15 DIAGNOSIS — E663 Overweight: Secondary | ICD-10-CM

## 2021-11-15 DIAGNOSIS — H6992 Unspecified Eustachian tube disorder, left ear: Secondary | ICD-10-CM

## 2021-11-15 DIAGNOSIS — H6982 Other specified disorders of Eustachian tube, left ear: Secondary | ICD-10-CM | POA: Diagnosis not present

## 2021-11-15 MED ORDER — PHENTERMINE HCL 37.5 MG PO CAPS: 37.5000 mg | ORAL_CAPSULE | ORAL | 1 refills | Status: DC

## 2021-11-15 NOTE — Progress Notes (Signed)
? ?  Subjective:  ? ? Patient ID: Pamela Lopez, female    DOB: 1975/09/10, 46 y.o.   MRN: [id number] ? ?HPI ?Here for several issues. First she has tried to lose weight for several years by dieting and by working out, but she continues to gain weight none the less. She asks for a medication to help. Also for the past 3 days she has had pain in the left ear. Her allergies are causing some sneezing and PND, but no fever or cough or SOB or ST.  ? ? ?Review of Systems  ?Constitutional: Negative.   ?HENT:  Positive for congestion, ear pain, postnasal drip and sneezing. Negative for sore throat.   ?Eyes: Negative.   ?Respiratory: Negative.    ? ?   ?Objective:  ? Physical Exam ?Constitutional:   ?   Appearance: She is obese.  ?HENT:  ?   Right Ear: Tympanic membrane, ear canal and external ear normal.  ?   Left Ear: Tympanic membrane, ear canal and external ear normal.  ?   Nose: Nose normal.  ?   Mouth/Throat:  ?   Pharynx: Oropharynx is clear.  ?Eyes:  ?   Conjunctiva/sclera: Conjunctivae normal.  ?Cardiovascular:  ?   Rate and Rhythm: Normal rate and regular rhythm.  ?   Pulses: Normal pulses.  ?   Heart sounds: Normal heart sounds.  ?Pulmonary:  ?   Effort: Pulmonary effort is normal.  ?   Breath sounds: Normal breath sounds.  ?Lymphadenopathy:  ?   Cervical: No cervical adenopathy.  ?Neurological:  ?   Mental Status: She is alert.  ? ? ? ? ? ?   ?Assessment & Plan:  ?For the obesity, she will try Phentermine 37.5 mg daily. Her ear pain is due to eustachian tube dysfunction, and this is caused by allergies. She will take Claritin D BID as needed.  ?Alysia Penna, MD ? ? ?

## 2021-11-20 ENCOUNTER — Telehealth: Payer: Self-pay | Admitting: Family Medicine

## 2021-11-20 NOTE — Telephone Encounter (Signed)
Please advise 

## 2021-11-20 NOTE — Telephone Encounter (Signed)
Pt just started this medication phentermine 37.5 MG capsule Friday 11-16-2021 and having insomnia and headaches and would like to know if this is normal and would like md advice ?

## 2021-11-21 MED ORDER — PHENTERMINE HCL 15 MG PO CAPS: 15.0000 mg | ORAL_CAPSULE | ORAL | 0 refills | Status: DC

## 2021-11-21 NOTE — Addendum Note (Signed)
Addended by: Alysia Penna A on: 11/21/2021 08:17 AM ? ? Modules accepted: Orders ? ?

## 2021-11-21 NOTE — Telephone Encounter (Signed)
Yes these are common side effects if the dose is too high. Stop this. Instead I sent in a new RX for 15 mg Phentermine for her to try. Hopefully she can tolerate this better  ?

## 2021-11-21 NOTE — Telephone Encounter (Signed)
Spoke with patient about message.    Voiced understanding.    

## 2022-02-04 ENCOUNTER — Encounter: Payer: Self-pay | Admitting: Dermatology

## 2022-02-04 MED ORDER — KETOCONAZOLE 2 % EX CREA: 1.0000 | TOPICAL_CREAM | Freq: Two times a day (BID) | CUTANEOUS | 0 refills | Status: AC

## 2022-04-11 ENCOUNTER — Ambulatory Visit: Payer: BC Managed Care – PPO | Admitting: Pulmonary Disease

## 2022-04-11 ENCOUNTER — Encounter: Payer: Self-pay | Admitting: Pulmonary Disease

## 2022-04-11 VITALS — BP 120/78 | HR 75 | Temp 97.9°F | Ht 66.5 in | Wt 295.0 lb

## 2022-04-11 DIAGNOSIS — Z9989 Dependence on other enabling machines and devices: Secondary | ICD-10-CM

## 2022-04-11 DIAGNOSIS — G4733 Obstructive sleep apnea (adult) (pediatric): Secondary | ICD-10-CM | POA: Diagnosis not present

## 2022-04-11 MED ORDER — ZOLPIDEM TARTRATE 10 MG PO TABS: 10.0000 mg | ORAL_TABLET | Freq: Every evening | ORAL | 2 refills | Status: DC | PRN

## 2022-04-11 NOTE — Patient Instructions (Signed)
Nasal saline spray Ayr nasal gel  Above may help nasal dryness and nosebleeds  If above does not help, adjusting the humidification in the machine may help  Call us with any other significant concerns  Follow-up in 6 months
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
## 2022-06-12 ENCOUNTER — Emergency Department (HOSPITAL_COMMUNITY): Payer: BC Managed Care – PPO

## 2022-06-12 ENCOUNTER — Emergency Department (HOSPITAL_COMMUNITY)
Admission: EM | Admit: 2022-06-12 | Discharge: 2022-06-12 | Disposition: A | Discharge status: Home / Self Care | Payer: BC Managed Care – PPO | Attending: Emergency Medicine | Admitting: Emergency Medicine

## 2022-06-12 ENCOUNTER — Encounter (HOSPITAL_COMMUNITY): Payer: Self-pay | Admitting: Emergency Medicine

## 2022-06-12 DIAGNOSIS — R1031 Right lower quadrant pain: Secondary | ICD-10-CM | POA: Insufficient documentation

## 2022-06-12 DIAGNOSIS — R110 Nausea: Secondary | ICD-10-CM | POA: Insufficient documentation

## 2022-06-12 DIAGNOSIS — R109 Unspecified abdominal pain: Secondary | ICD-10-CM

## 2022-06-12 DIAGNOSIS — R1011 Right upper quadrant pain: Secondary | ICD-10-CM | POA: Insufficient documentation

## 2022-06-12 DIAGNOSIS — Z91040 Latex allergy status: Secondary | ICD-10-CM | POA: Insufficient documentation

## 2022-06-12 LAB — CBC WITH DIFFERENTIAL/PLATELET
Abs Immature Granulocytes: 0.04 10*3/uL (ref 0.00–0.07)
Basophils Absolute: 0 10*3/uL (ref 0.0–0.1)
Basophils Relative: 1 %
Eosinophils Absolute: 0 10*3/uL (ref 0.0–0.5)
Eosinophils Relative: 0 %
HCT: 41.8 % (ref 36.0–46.0)
Hemoglobin: 13.5 g/dL (ref 12.0–15.0)
Immature Granulocytes: 1 %
Lymphocytes Relative: 19 %
Lymphs Abs: 1.1 10*3/uL (ref 0.7–4.0)
MCH: 28.1 pg (ref 26.0–34.0)
MCHC: 32.3 g/dL (ref 30.0–36.0)
MCV: 86.9 fL (ref 80.0–100.0)
Monocytes Absolute: 0.3 10*3/uL (ref 0.1–1.0)
Monocytes Relative: 4 %
Neutro Abs: 4.4 10*3/uL (ref 1.7–7.7)
Neutrophils Relative %: 75 %
Platelets: 282 10*3/uL (ref 150–400)
RBC: 4.81 MIL/uL (ref 3.87–5.11)
RDW: 14.1 % (ref 11.5–15.5)
WBC: 5.8 10*3/uL (ref 4.0–10.5)
nRBC: 0 % (ref 0.0–0.2)

## 2022-06-12 LAB — COMPREHENSIVE METABOLIC PANEL
ALT: 13 U/L (ref 0–44)
AST: 17 U/L (ref 15–41)
Albumin: 3.9 g/dL (ref 3.5–5.0)
Alkaline Phosphatase: 45 U/L (ref 38–126)
Anion gap: 11 (ref 5–15)
BUN: 9 mg/dL (ref 6–20)
CO2: 24 mmol/L (ref 22–32)
Calcium: 9.6 mg/dL (ref 8.9–10.3)
Chloride: 105 mmol/L (ref 98–111)
Creatinine, Ser: 0.77 mg/dL (ref 0.44–1.00)
GFR, Estimated: >60 mL/min (ref >60)
Glucose, Bld: 91 mg/dL (ref 70–99)
Potassium: 4 mmol/L (ref 3.5–5.1)
Sodium: 140 mmol/L (ref 135–145)
Total Bilirubin: 0.4 mg/dL (ref 0.3–1.2)
Total Protein: 7.7 g/dL (ref 6.5–8.1)

## 2022-06-12 LAB — URINALYSIS, ROUTINE W REFLEX MICROSCOPIC
Bilirubin Urine: NEGATIVE
Glucose, UA: NEGATIVE mg/dL
Ketones, ur: NEGATIVE mg/dL
Leukocytes,Ua: NEGATIVE
Nitrite: NEGATIVE
Protein, ur: NEGATIVE mg/dL
Specific Gravity, Urine: 1.014 (ref 1.005–1.030)
pH: 5 (ref 5.0–8.0)

## 2022-06-12 LAB — I-STAT BETA HCG BLOOD, ED (MC, WL, AP ONLY): I-stat hCG, quantitative: <5 m[IU]/mL (ref <5)

## 2022-06-12 LAB — LIPASE, BLOOD: Lipase: 31 U/L (ref 11–51)

## 2022-06-12 MED ORDER — IOHEXOL 350 MG/ML SOLN
75.0000 mL | Freq: Once | INTRAVENOUS | Status: AC | PRN
  Administered 2022-06-12: 75 mL via INTRAVENOUS

## 2022-06-12 MED ORDER — MORPHINE SULFATE (PF) 4 MG/ML IV SOLN
4.0000 mg | Freq: Once | INTRAVENOUS | Status: AC
  Administered 2022-06-12: 4 mg via INTRAVENOUS
  Filled 2022-06-12: qty 1

## 2022-06-12 MED ORDER — ONDANSETRON 4 MG PO TBDP
4.0000 mg | ORAL_TABLET | Freq: Once | ORAL | Status: AC
  Administered 2022-06-12: 4 mg via ORAL
  Filled 2022-06-12: qty 1

## 2022-06-12 MED ORDER — ONDANSETRON HCL 4 MG/2ML IJ SOLN
4.0000 mg | Freq: Once | INTRAMUSCULAR | Status: AC
  Administered 2022-06-12: 4 mg via INTRAVENOUS
  Filled 2022-06-12: qty 2

## 2022-06-12 MED ORDER — KETOROLAC TROMETHAMINE 30 MG/ML IJ SOLN
30.0000 mg | Freq: Once | INTRAMUSCULAR | Status: AC
  Administered 2022-06-12: 30 mg via INTRAVENOUS
  Filled 2022-06-12: qty 1

## 2022-06-12 MED ORDER — ONDANSETRON HCL 4 MG PO TABS: 4.0000 mg | ORAL_TABLET | Freq: Four times a day (QID) | ORAL | 0 refills | Status: DC

## 2022-06-12 NOTE — ED Provider Notes (Signed)
[facility] EMERGENCY DEPARTMENT Provider Note   CSN: [id number] Arrival date & time: 06/12/22  1056     History  Chief Complaint  Patient presents with   Abdominal Pain   HPI Pamela Lopez is a 46 y.o. female with history of cholecystectomy in 3500 and umbilical hernia repair in 1997 presenting for abdominal pain.  Pain started at 2 AM this morning, and woke her from sleep.  Located in the right lower quadrant.  Pain is constant and sharp.  At times feels like spasms.  She is taken no meds for her symptoms.  The pain is nonradiating.  Denies fever.  Endorses nausea but no vomiting or diarrhea.  States she did have a normal bowel movement this morning.  Denies painful urination, hematuria and malodorous urine.  Denies bloody stools and hematemesis.   Abdominal Pain      Home Medications Prior to Admission medications   Medication Sig Start Date End Date Taking? Authorizing Provider  ondansetron (ZOFRAN) 4 MG tablet Take 1 tablet (4 mg total) by mouth every 6 (six) hours. 06/12/22  Yes Harriet Pho, PA-C  Biotin 5000 MCG TABS Take 5,000 mcg by mouth 2 (two) times daily.    [provider]  levalbuterol Penne Lash HFA) 45 MCG/ACT inhaler Inhale 2 puffs into the lungs every 4 (four) hours as needed for wheezing or shortness of breath. 06/26/21   Laurey Morale, MD  Multiple Vitamin (MULTIVITAMIN) tablet Take 1 tablet by mouth daily.    [provider]  naproxen sodium (ANAPROX) 550 MG tablet naproxen sodium 550 mg tablet  TAKE 1 TABLET TWICE A DAY WITH FOOD STARTING 1 2 DAYS BEFORE MENSES X3 DAYS    [provider]  norethindrone-ethinyl estradiol (JUNEL FE,GILDESS FE,LOESTRIN FE) 1-20 MG-MCG tablet Take 1 tablet by mouth daily.    [provider]  promethazine (PHENERGAN) 25 MG tablet TAKE 1 TABLET BY MOUTH EVERY 4 HOURS AS NEEDED FOR NAUSEA AND VOMITING 06/26/21   Laurey Morale, MD  tiZANidine (ZANAFLEX) 4 MG capsule Take 4  mg by mouth daily as needed for muscle spasms.    [provider]  zolpidem (AMBIEN) 10 MG tablet Take 1 tablet (10 mg total) by mouth at bedtime as needed for sleep. 04/11/22 07/10/22  Laurin Coder, MD      Allergies    Latex, Codeine, Miconazole nitrate, Monistat [miconazole nitrate-wipes], and Penicillins    Review of Systems   Review of Systems  Gastrointestinal:  Positive for abdominal pain.    Physical Exam Updated Vital Signs BP (!) 152/76   Pulse 82   Temp 98.2 F (36.8 C) (Oral)   Resp 16   SpO2 100%  Physical Exam Vitals and nursing note reviewed.  HENT:     Head: Normocephalic and atraumatic.     Mouth/Throat:     Mouth: Mucous membranes are moist.  Eyes:     General:        Right eye: No discharge.        Left eye: No discharge.     Conjunctiva/sclera: Conjunctivae normal.  Cardiovascular:     Rate and Rhythm: Normal rate and regular rhythm.     Pulses: Normal pulses.     Heart sounds: Normal heart sounds.  Pulmonary:     Effort: Pulmonary effort is normal.     Breath sounds: Normal breath sounds.  Abdominal:     General: Abdomen is flat.     Palpations: Abdomen  is soft.     Tenderness: There is abdominal tenderness in the right upper quadrant and right lower quadrant.  Skin:    General: Skin is warm and dry.  Neurological:     General: No focal deficit present.  Psychiatric:        Mood and Affect: Mood normal.     ED Results / Procedures / Treatments   Labs (all labs ordered are listed, but only abnormal results are displayed) Labs Reviewed  URINALYSIS, ROUTINE W REFLEX MICROSCOPIC - Abnormal; Notable for the following components:      Result Value   Hgb urine dipstick MODERATE (*)    Bacteria, UA RARE (*)    All other components within normal limits  CBC WITH DIFFERENTIAL/PLATELET  COMPREHENSIVE METABOLIC PANEL  LIPASE, BLOOD  I-STAT BETA HCG BLOOD, ED ([facility], [facility], [facility] ONLY)    EKG None  Radiology CT Abdomen Pelvis W  Contrast  Result Date: 06/12/2022 CLINICAL DATA:  Sudden onset right lower quadrant abdominal pain, nausea EXAM: CT ABDOMEN AND PELVIS WITH CONTRAST TECHNIQUE: Multidetector CT imaging of the abdomen and pelvis was performed using the standard protocol following bolus administration of intravenous contrast. RADIATION DOSE REDUCTION: This exam was performed according to the departmental dose-optimization program which includes automated exposure control, adjustment of the mA and/or kV according to patient size and/or use of iterative reconstruction technique. CONTRAST:  87m OMNIPAQUE IOHEXOL 350 MG/ML SOLN COMPARISON:  09/15/2018 FINDINGS: Lower chest: No acute pleural or parenchymal lung disease. Hepatobiliary: No focal liver abnormality is seen. Status post cholecystectomy. No biliary dilatation. Pancreas: Unremarkable. No pancreatic ductal dilatation or surrounding inflammatory changes. Spleen: Normal in size without focal abnormality. Adrenals/Urinary Tract: 1.8 cm left renal cyst does not require imaging follow-up. Otherwise the kidneys enhance normally and symmetrically. No urinary tract calculi or obstructive uropathy. The adrenals and bladder are unremarkable. Stomach/Bowel: No bowel obstruction or ileus. Normal gas-filled appendix is seen in a retrocecal location. No bowel wall thickening or inflammatory change. Vascular/Lymphatic: No significant vascular findings are present. No enlarged abdominal or pelvic lymph nodes. Reproductive: 4.0 x 3.2 x 3.5 cm exophytic mass off the posterior aspect of the uterine fundus consistent with subserosal fibroid. Otherwise the uterus and adnexal structures are unremarkable. Other: No free fluid or free intraperitoneal gas. No abdominal wall hernia. Prior umbilical hernia repair. Musculoskeletal: No acute or destructive bony lesions. Reconstructed images demonstrate no additional findings. IMPRESSION: 1. No acute intra-abdominal or intrapelvic process. Normal appendix.  2. Exophytic subserosal uterine fibroid. Electronically Signed   By: MRanda NgoM.D.   On: 06/12/2022 18:59    Procedures Procedures    Medications Ordered in ED Medications  ondansetron (ZOFRAN-ODT) disintegrating tablet 4 mg (4 mg Oral Given 06/12/22 1231)  ondansetron (ZOFRAN) injection 4 mg (4 mg Intravenous Given 06/12/22 1655)  morphine (PF) 4 MG/ML injection 4 mg (4 mg Intravenous Given 06/12/22 1655)  ketorolac (TORADOL) 30 MG/ML injection 30 mg (30 mg Intravenous Given 06/12/22 1851)  iohexol (OMNIPAQUE) 350 MG/ML injection 75 mL (75 mLs Intravenous Contrast Given 06/12/22 1830)    ED Course/ Medical Decision Making/ A&P                           Medical Decision Making Amount and/or Complexity of Data Reviewed Radiology: ordered.  Risk Prescription drug management.   This patient presents to the ED for concern of abdominal pain, this involves a number of treatment options, and is a complaint that  carries with it a high risk of complications and morbidity.  The differential diagnosis includes bowel obstruction, appendicitis, and pancreatitis.   Co morbidities: Discussed in HPI   EMR reviewed including pt PMHx, past surgical history and past visits to ER.   See HPI for more details   Lab Tests:   I personally reviewed all laboratory work and imaging. Metabolic panel without any acute abnormality specifically kidney function within normal limits and no significant electrolyte abnormalities. CBC without leukocytosis or significant anemia.   Imaging Studies:  NAD. I personally reviewed all imaging studies and no acute abnormality found. I agree with radiology interpretation.    Cardiac Monitoring:  The patient was maintained on a cardiac monitor.  I personally viewed and interpreted the cardiac monitored which showed an underlying rhythm of:NSR NA   Medicines ordered:  I ordered medication including morphine and Toradol for pain, Zofran for  nausea Reevaluation of the patient after these medicines showed that the patient improved I have reviewed the patients home medicines and have made adjustments as needed     Consults/Attending Physician   I discussed this case with my attending physician who cosigned this note including patient's presenting symptoms, physical exam, and planned diagnostics and interventions. Attending physician stated agreement with plan or made changes to plan which were implemented.   Reevaluation:  After the interventions noted above I re-evaluated patient and found that they have :improved     Problem List / ED Course: Presented for abdominal pain.  Pain was localized in the right upper and right lower quadrant.  Patient stated that she did have her gallbladder removed in 2020.  Initial concern was appendicitis.  Unlikely given the results of the CT scan.  Treated her pain and nausea.  Upon reevaluation patient stated that she feels much better.  Advised patient to follow-up with her PCP for ongoing abdominal pain.  Discussed return precautions.   Dispostion:  After consideration of the diagnostic results and the patients response to treatment, I feel that the patient would benefit from discharge and follow up with PCP for ongoing abdominal pain.         Final Clinical Impression(s) / ED Diagnoses Final diagnoses:  Abdominal pain, unspecified abdominal location    Rx / DC Orders ED Discharge Orders          Ordered    ondansetron (ZOFRAN) 4 MG tablet  Every 6 hours        06/12/22 2128              Harriet Pho, PA-C 06/12/22 2129    Kemper Durie, DO 06/12/22 2247

## 2022-06-12 NOTE — ED Provider Triage Note (Addendum)
Emergency Medicine Provider Triage Evaluation Note  Pamela Lopez , a 46 y.o. female  was evaluated in triage.  Pt complains of sudden onset of right-sided abdominal pain around 2 AM this morning.  States it woke her up out of bed.  With some nausea, however denies vomiting.  Also with 1-2 loose stools since symptom onset.  Pain described as constant and sharp/dull.  Worse with certain movements or with palpation.  Also with 1 episode of "tingling" with urination just prior to ED arrival.  Denies fevers, chills, vaginal symptoms, or blood in the urine.  Hx of cholecystectomy  Review of Systems  Positive:  Negative: See above  Physical Exam  BP (!) 166/104 (BP Location: Left Arm)   Pulse 85   Temp 98.3 F (36.8 C) (Oral)   Resp 16   SpO2 100%  Gen:   Awake, no distress   Resp:  Normal effort, communicates well without difficulty MSK:   Moves extremities without difficulty  Other:  Mild right-sided abdominal tenderness, abdomen soft.  No suprapubic tenderness.  No McBurney's point tenderness.  Does not appear jaundiced.  Medical Decision Making  Medically screening exam initiated at 12:17 PM.  Appropriate orders placed.  JERUSHA REISING was informed that the remainder of the evaluation will be completed by another provider, this initial triage assessment does not replace that evaluation, and the importance of remaining in the ED until their evaluation is complete.     Prince Rome, PA-C 90/21/11 5520    Prince Rome, PA-C 80/22/33 1221

## 2022-06-12 NOTE — ED Triage Notes (Signed)
Pt here from home with c/ right lower quad abd pain that started this morning, slight nausea and  slight discomfort urinating

## 2022-06-12 NOTE — Discharge Instructions (Addendum)
Evaluation for your abdominal pain was overall reassuring. CT scan did not reveal concern for an acute process that may be causing your pain.  Did show that you do have a uterine fibroid.  Does not require treatment at this time.  Recommend that you follow-up with your PCP for ongoing abdominal pain.  If you have worsening abdominal pain, chest pain, new fever or shortness of breath please return to emergency department for further evaluation.  I have prescribed Zofran for you to take as needed for nausea.

## 2022-06-17 ENCOUNTER — Encounter: Payer: Self-pay | Admitting: Family Medicine

## 2022-06-17 ENCOUNTER — Ambulatory Visit: Payer: BC Managed Care – PPO | Admitting: Family Medicine

## 2022-06-17 ENCOUNTER — Telehealth: Payer: Self-pay | Admitting: Family Medicine

## 2022-06-17 VITALS — BP 132/80 | HR 73 | Temp 98.0°F | Wt 289.0 lb

## 2022-06-17 DIAGNOSIS — R1031 Right lower quadrant pain: Secondary | ICD-10-CM | POA: Diagnosis not present

## 2022-06-17 LAB — POC URINALSYSI DIPSTICK (AUTOMATED)
Bilirubin, UA: NEGATIVE
Blood, UA: POSITIVE
Glucose, UA: NEGATIVE
Ketones, UA: NEGATIVE
Leukocytes, UA: NEGATIVE
Nitrite, UA: NEGATIVE
Protein, UA: POSITIVE — AB
Spec Grav, UA: 1.02 (ref 1.010–1.025)
Urobilinogen, UA: 0.2 E.U./dL
pH, UA: 6 (ref 5.0–8.0)

## 2022-06-17 NOTE — Telephone Encounter (Addendum)
Pt was just here at 10:15 am and is asking if her discharge papers are found, please hold them for her until 06/28/22. Pt thinks she left them in the exam room or in the rest room.

## 2022-06-17 NOTE — Addendum Note (Signed)
Addended by: Wyvonne Lenz on: 06/17/2022 04:18 PM   Modules accepted: Orders

## 2022-06-17 NOTE — Progress Notes (Signed)
   Subjective:    Patient ID: Pamela Lopez, female    DOB: 1976/06/03, 46 y.o.   MRN: [id number]  HPI Here to follow up an ED visit on 06-12-22. That day she was awakened by a severe pain in the RLQ of her abdomen. The pain was dull and constant, though she had intermittent sharp pains in the RLQ as well. No fever. She was nauseated but had not vomited. No urinary symptoms. She had passed a normal BM the day before. At the ED she was tender in the RUQ and RLQ on exam. Her labs were normal except her UA had some blood in it (she was on her menstrual cycle at the time). A CT of the abdomen and pelvis showed a 4 cm uterine fibroid but was otherwise normal. She was given IV fluids and sent hom. Later that day she had 2 watery stools at home. Then she had no stools for 2 days, then she passed a normal stool. Her abdominal pain has improved since then, and today she has no pain at all.   Review of Systems  Constitutional: Negative.   Respiratory: Negative.    Cardiovascular: Negative.   Gastrointestinal: Negative.   Genitourinary: Negative.        Objective:   Physical Exam Constitutional:      Appearance: Normal appearance. She is not ill-appearing.  Cardiovascular:     Rate and Rhythm: Normal rate and regular rhythm.     Pulses: Normal pulses.     Heart sounds: Normal heart sounds.  Pulmonary:     Effort: Pulmonary effort is normal.     Breath sounds: Normal breath sounds.  Abdominal:     General: Abdomen is flat. Bowel sounds are normal. There is no distension.     Palpations: Abdomen is soft. There is no mass.     Tenderness: There is no abdominal tenderness. There is no right CVA tenderness, left CVA tenderness, guarding or rebound.     Hernia: No hernia is present.  Neurological:     Mental Status: She is alert.           Assessment & Plan:  Seems to have had a viral enteritis, and this has now resolved. She will see her GYN on 06-24-22, and she will see [place] again on  06-28-22. We spent a total of (34   ) minutes reviewing records and discussing these issues.  Alysia Penna, MD

## 2022-06-17 NOTE — Telephone Encounter (Signed)
Paper work found, pt notified via  [place] that she can  pick up at her CPE appointment

## 2022-06-18 NOTE — Telephone Encounter (Signed)
Thank you, Izora Gala!!

## 2022-06-19 ENCOUNTER — Encounter: Payer: Self-pay | Admitting: Podiatry

## 2022-06-19 ENCOUNTER — Ambulatory Visit: Payer: BC Managed Care – PPO | Admitting: Podiatry

## 2022-06-19 DIAGNOSIS — M722 Plantar fascial fibromatosis: Secondary | ICD-10-CM

## 2022-06-19 MED ORDER — TRIAMCINOLONE ACETONIDE 10 MG/ML IJ SUSP
20.0000 mg | Freq: Once | INTRAMUSCULAR | Status: AC
  Administered 2022-06-19: 20 mg

## 2022-06-19 NOTE — Progress Notes (Signed)
Subjective:   Patient ID: Pamela Lopez, female   DOB: 46 y.o.   MRN: [id number]   HPI Patient presents stating the heels on both of her feet have started to get sore again and she was pretty good for around 8 or 9 months   ROS      Objective:  Physical Exam  Neuro vascular status intact with reoccurrence of discomfort plantar heel region bilateral with inflammation fluid with patient doing well with orthotics but is on her feet a lot with obesity     Assessment:  Inflammatory fasciitis bilateral     Plan:  Reviewed condition importance of stretch importance of good support shoes with orthotics and did sterile prep reinjected the fascia bilateral 3 mg Kenalog 5 mg Xylocaine applied dressings instructed on reduced activity

## 2022-06-28 ENCOUNTER — Encounter: Payer: Self-pay | Admitting: Family Medicine

## 2022-06-28 ENCOUNTER — Ambulatory Visit (INDEPENDENT_AMBULATORY_CARE_PROVIDER_SITE_OTHER): Payer: BC Managed Care – PPO | Admitting: Family Medicine

## 2022-06-28 VITALS — BP 128/82 | HR 87 | Temp 98.5°F | Ht 66.5 in | Wt 291.0 lb

## 2022-06-28 DIAGNOSIS — Z23 Encounter for immunization: Secondary | ICD-10-CM | POA: Diagnosis not present

## 2022-06-28 DIAGNOSIS — Z0000 Encounter for general adult medical examination without abnormal findings: Secondary | ICD-10-CM | POA: Diagnosis not present

## 2022-06-28 LAB — LIPID PANEL
Cholesterol: 162 mg/dL (ref 0–200)
HDL: 67.8 mg/dL (ref >39.00)
LDL Cholesterol: 81 mg/dL (ref 0–99)
NonHDL: 94.39
Total CHOL/HDL Ratio: 2
Triglycerides: 67 mg/dL (ref 0.0–149.0)
VLDL: 13.4 mg/dL (ref 0.0–40.0)

## 2022-06-28 LAB — HEMOGLOBIN A1C: Hgb A1c MFr Bld: 5.9 % (ref 4.6–6.5)

## 2022-06-28 LAB — TSH: TSH: 1.18 u[IU]/mL (ref 0.35–5.50)

## 2022-06-28 MED ORDER — LEVALBUTEROL TARTRATE 45 MCG/ACT IN AERO: 2.0000 | INHALATION_SPRAY | RESPIRATORY_TRACT | 11 refills | Status: DC | PRN

## 2022-06-28 MED ORDER — PHENTERMINE HCL 37.5 MG PO CAPS: 37.5000 mg | ORAL_CAPSULE | ORAL | 5 refills | Status: DC

## 2022-06-28 MED ORDER — PROMETHAZINE HCL 25 MG PO TABS: ORAL_TABLET | ORAL | 3 refills | Status: DC

## 2022-06-28 NOTE — Progress Notes (Signed)
   Subjective:    Patient ID: Pamela Lopez, female    DOB: Mar 27, 1976, 46 y.o.   MRN: [id number]  HPI Here for a well exam. She had no concerns except she wants to try Phentermine again to lose weight.    Review of Systems  Constitutional: Negative.   HENT: Negative.    Eyes: Negative.   Respiratory: Negative.    Cardiovascular: Negative.   Gastrointestinal: Negative.   Genitourinary:  Negative for decreased urine volume, difficulty urinating, dyspareunia, dysuria, enuresis, flank pain, frequency, hematuria, pelvic pain and urgency.  Musculoskeletal: Negative.   Skin: Negative.   Neurological: Negative.  Negative for headaches.  Psychiatric/Behavioral: Negative.         Objective:   Physical Exam Constitutional:      General: She is not in acute distress.    Appearance: She is well-developed. She is obese.  HENT:     Head: Normocephalic and atraumatic.     Right Ear: External ear normal.     Left Ear: External ear normal.     Nose: Nose normal.     Mouth/Throat:     Pharynx: No oropharyngeal exudate.  Eyes:     General: No scleral icterus.    Conjunctiva/sclera: Conjunctivae normal.     Pupils: Pupils are equal, round, and reactive to light.  Neck:     Thyroid: No thyromegaly.     Vascular: No JVD.  Cardiovascular:     Rate and Rhythm: Normal rate and regular rhythm.     Heart sounds: Normal heart sounds. No murmur heard.    No friction rub. No gallop.  Pulmonary:     Effort: Pulmonary effort is normal. No respiratory distress.     Breath sounds: Normal breath sounds. No wheezing or rales.  Chest:     Chest wall: No tenderness.  Abdominal:     General: Bowel sounds are normal. There is no distension.     Palpations: Abdomen is soft. There is no mass.     Tenderness: There is no abdominal tenderness. There is no guarding or rebound.  Musculoskeletal:        General: No tenderness. Normal range of motion.     Cervical back: Normal range of motion and neck  supple.  Lymphadenopathy:     Cervical: No cervical adenopathy.  Skin:    General: Skin is warm and dry.     Findings: No erythema or rash.  Neurological:     Mental Status: She is alert and oriented to person, place, and time.     Cranial Nerves: No cranial nerve deficit.     Motor: No abnormal muscle tone.     Coordination: Coordination normal.     Deep Tendon Reflexes: Reflexes are normal and symmetric. Reflexes normal.  Psychiatric:        Behavior: Behavior normal.        Thought Content: Thought content normal.        Judgment: Judgment normal.           Assessment & Plan:  Well exam. We discussed diet and exercise. Get fasting labs. Alysia Penna, MD

## 2022-08-09 ENCOUNTER — Ambulatory Visit (INDEPENDENT_AMBULATORY_CARE_PROVIDER_SITE_OTHER): Payer: BC Managed Care – PPO | Admitting: Family Medicine

## 2022-08-09 ENCOUNTER — Encounter: Payer: Self-pay | Admitting: Family Medicine

## 2022-08-09 VITALS — BP 144/98 | HR 96 | Temp 97.9°F | Ht 66.5 in | Wt 289.7 lb

## 2022-08-09 DIAGNOSIS — J0100 Acute maxillary sinusitis, unspecified: Secondary | ICD-10-CM

## 2022-08-09 MED ORDER — BENZONATATE 100 MG PO CAPS: 100.0000 mg | ORAL_CAPSULE | Freq: Two times a day (BID) | ORAL | 0 refills | Status: DC | PRN

## 2022-08-09 MED ORDER — AZITHROMYCIN 250 MG PO TABS: ORAL_TABLET | ORAL | 0 refills | Status: DC

## 2022-08-09 MED ORDER — METHYLPREDNISOLONE 4 MG PO TBPK: ORAL_TABLET | ORAL | 0 refills | Status: DC

## 2022-08-09 NOTE — Progress Notes (Signed)
Established Patient Office Visit  Subjective   Patient ID: Pamela Lopez, female    DOB: September 06, 1975  Age: 46 y.o. MRN: [id number]  Chief Complaint  Patient presents with   Cough    Non-productive x2 weeks, tried Mucinex with no relief    Pt reports she started feeling ill around 12/9, she had stuffy nose, sore throat, cold symptoms, states that the mucus production was clear, then after that she developed a chronic cough, dry in nature, worse at night, some throughout the day as well. Some subjective chills, no sinus pressure or pain, has been using flonase and mucinex without much improvement.  Pt states she usually gets a sinus infection this time of the year, has a history of mild intermittent asthma and uses an albuterol inhaler at home occasionally. Has been using it more often since she has been ill.    Patient Active Problem List   Diagnosis Date Noted   Environmental and seasonal allergies 11/15/2021   OSA (obstructive sleep apnea) 05/09/2020   Depression with anxiety 04/21/2020   Bilateral leg edema 04/04/2016   Benign paroxysmal positional vertigo 02/21/2014   IC (interstitial cystitis) 07/06/2012   FATIGUE 12/02/2008   Overweight 03/25/2008   GERD 03/25/2008   HEADACHE 12/30/2007   CARPAL TUNNEL SYNDROME 08/18/2007   LUMBAR STRAIN 06/19/2007   Essential hypertension 05/26/2007   ASTHMA 05/26/2007      Review of Systems  All other systems reviewed and are negative.     Objective:     BP (!) 144/98 (BP Location: Right Arm, Patient Position: Sitting, Cuff Size: Large)   Pulse 96   Temp 97.9 F (36.6 C) (Oral)   Ht 5' 6.5" (1.689 m)   Wt 289 lb 11.2 oz (131.4 kg)   LMP 08/07/2022 (Exact Date)   SpO2 98%   BMI 46.06 kg/m    Physical Exam Vitals reviewed.  Constitutional:      Appearance: Normal appearance. She is well-groomed. She is obese.  HENT:     Right Ear: Tympanic membrane normal.     Left Ear: Tympanic membrane normal.     Nose:  Congestion present.     Mouth/Throat:     Pharynx: No oropharyngeal exudate or posterior oropharyngeal erythema.  Eyes:     Conjunctiva/sclera: Conjunctivae normal.  Cardiovascular:     Rate and Rhythm: Normal rate and regular rhythm.     Pulses: Normal pulses.     Heart sounds: S1 normal and S2 normal.  Pulmonary:     Effort: Pulmonary effort is normal.     Breath sounds: Normal breath sounds and air entry. No wheezing or rhonchi.  Musculoskeletal:     Right lower leg: No edema.     Left lower leg: No edema.  Lymphadenopathy:     Cervical: No cervical adenopathy.  Neurological:     Mental Status: She is alert and oriented to person, place, and time. Mental status is at baseline.     Gait: Gait is intact.  Psychiatric:        Mood and Affect: Mood and affect normal.        Speech: Speech normal.        Behavior: Behavior normal.        Judgment: Judgment normal.      No results found for any visits on 08/09/22.    The 10-year ASCVD risk score (Arnett DK, et al., 2019) is: 1%    Assessment & Plan:   Problem List Items  Addressed This Visit   None Visit Diagnoses     Acute non-recurrent maxillary sinusitis    -  Primary   Relevant Medications   azithromycin (ZITHROMAX Z-PAK) 250 MG tablet   methylPREDNISolone (MEDROL DOSEPAK) 4 MG TBPK tablet   benzonatate (TESSALON) 100 MG capsule  >2 weeks of illness with significant persistent post nasal drip and nasal congestion that has not improved over time since the initial symptoms. I did not hear any wheezing on exam today, however I will go ahead and treat with zithromax 5 day course, medrol dose pak for 6 days, and benzonatate 100 mg at bedtime as needed for cough.      No follow-ups on file.    Farrel Conners, MD

## 2022-08-14 ENCOUNTER — Telehealth: Payer: Self-pay | Admitting: Family Medicine

## 2022-08-14 NOTE — Telephone Encounter (Signed)
Patient informed of the message below.

## 2022-08-14 NOTE — Telephone Encounter (Signed)
Noted  

## 2022-08-14 NOTE — Telephone Encounter (Signed)
Unfortunately there isn't much we can do besides using once daily zyrtec 10 mg and possibly adding flonase nasal spray if she isn't already on it. We have had multiple patients with similar issues and nagging symptoms that have persisted for weeks. I would just continue OTC medications and wait for symptoms to improve.

## 2022-08-14 NOTE — Telephone Encounter (Signed)
Pt saw Dr. Legrand Como on 08/09/22. Pt called to say she just finished her prednisone, but is still coughing. Reports no fever, no chills. Pt is wondering if she should be doing or taking something else?  Please advise.

## 2022-10-16 ENCOUNTER — Encounter: Payer: Self-pay | Admitting: Pulmonary Disease

## 2022-10-16 ENCOUNTER — Ambulatory Visit (INDEPENDENT_AMBULATORY_CARE_PROVIDER_SITE_OTHER): Payer: BC Managed Care – PPO | Admitting: Pulmonary Disease

## 2022-10-16 VITALS — BP 130/82 | HR 87 | Ht 66.5 in | Wt 293.0 lb

## 2022-10-16 DIAGNOSIS — G4733 Obstructive sleep apnea (adult) (pediatric): Secondary | ICD-10-CM | POA: Diagnosis not present
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
## 2022-10-16 NOTE — Patient Instructions (Signed)
I will see you back in about 6 months  Continue using your CPAP on a nightly basis  Continue weight loss efforts  Call us with any significant concerns

## 2022-12-26 ENCOUNTER — Other Ambulatory Visit: Payer: Self-pay | Admitting: Family Medicine

## 2023-01-02 ENCOUNTER — Encounter: Payer: Self-pay | Admitting: Podiatry

## 2023-01-02 ENCOUNTER — Ambulatory Visit: Payer: BC Managed Care – PPO | Admitting: Podiatry

## 2023-01-02 DIAGNOSIS — M722 Plantar fascial fibromatosis: Secondary | ICD-10-CM

## 2023-01-02 MED ORDER — TRIAMCINOLONE ACETONIDE 10 MG/ML IJ SUSP
20.0000 mg | Freq: Once | INTRAMUSCULAR | Status: AC
  Administered 2023-01-02: 20 mg

## 2023-01-02 NOTE — Progress Notes (Signed)
Subjective:   Patient ID: Pamela Lopez, female   DOB: 47 y.o.   MRN: [id number]   HPI Patient states that she has had reoccurrence of heel pain she did very well for around 6 months but has become more active recently and started   ROS      Objective:  Physical Exam  Neurovascular status intact exquisite discomfort medial fascial band bilateral heels patient is also been using her night splint     Assessment:  Chronic Planter fasciitis bilateral heel region     Plan:  Sterile prep injected the fascia at insertion 3 mg Kenalog 5 mg Xylocaine and advised on support and stretch and reappoint to recheck

## 2023-01-20 ENCOUNTER — Telehealth: Payer: Self-pay | Admitting: Podiatry

## 2023-01-20 NOTE — Telephone Encounter (Signed)
Should be seen. Can use ice and stretch

## 2023-01-20 NOTE — Telephone Encounter (Signed)
Pt stated that her left heel is beginning to hurt again. She stated she went on vacation & was walking a lot, wants to know if she should come in for an office visit or is there anything she can do at home? Please advise

## 2023-01-23 ENCOUNTER — Encounter: Payer: Self-pay | Admitting: Podiatry

## 2023-01-23 ENCOUNTER — Ambulatory Visit (INDEPENDENT_AMBULATORY_CARE_PROVIDER_SITE_OTHER): Payer: BC Managed Care – PPO

## 2023-01-23 ENCOUNTER — Ambulatory Visit: Payer: BC Managed Care – PPO | Admitting: Podiatry

## 2023-01-23 DIAGNOSIS — M722 Plantar fascial fibromatosis: Secondary | ICD-10-CM

## 2023-01-23 DIAGNOSIS — M79672 Pain in left foot: Secondary | ICD-10-CM

## 2023-01-23 MED ORDER — TRIAMCINOLONE ACETONIDE 10 MG/ML IJ SUSP
10.0000 mg | Freq: Once | INTRAMUSCULAR | Status: AC
  Administered 2023-01-23: 10 mg

## 2023-01-23 MED ORDER — CELECOXIB 200 MG PO CAPS: 200.0000 mg | ORAL_CAPSULE | Freq: Two times a day (BID) | ORAL | 2 refills | Status: DC

## 2023-01-23 NOTE — Progress Notes (Signed)
Subjective:   Patient ID: Pamela Lopez, female   DOB: 47 y.o.   MRN: [id number]   HPI Patient states she was doing very well but she did a lot of walking and developed discomfort in the bottom of the left heel   ROS      Objective:  Physical Exam  Neurovascular status intact with inflammation pain plantar aspect left heel at the insertion tendon calcaneus     Assessment:  Acute Planter fasciitis left inflammation fluid buildup     Plan:  Reviewed being careful with shoes with flat bottoms and did go ahead today and reinjected the plantar fascia at insertion after sterile prep 3 mg Kenalog 5 mg Xylocaine applied sterile dressing.  Patient will be seen back as needed hopefully this will be the end of that it was just a flareup from walking too much

## 2023-01-30 ENCOUNTER — Encounter: Payer: Self-pay | Admitting: Family Medicine

## 2023-01-30 ENCOUNTER — Ambulatory Visit: Payer: BC Managed Care – PPO | Admitting: Family Medicine

## 2023-01-30 VITALS — BP 128/82 | HR 70 | Temp 97.7°F | Wt 271.0 lb

## 2023-01-30 DIAGNOSIS — K529 Noninfective gastroenteritis and colitis, unspecified: Secondary | ICD-10-CM

## 2023-01-30 DIAGNOSIS — J4530 Mild persistent asthma, uncomplicated: Secondary | ICD-10-CM

## 2023-01-30 DIAGNOSIS — J3089 Other allergic rhinitis: Secondary | ICD-10-CM | POA: Diagnosis not present

## 2023-01-30 MED ORDER — FLUCONAZOLE 150 MG PO TABS: 150.0000 mg | ORAL_TABLET | Freq: Every day | ORAL | 2 refills | Status: DC

## 2023-01-30 MED ORDER — CIPROFLOXACIN HCL 500 MG PO TABS: 500.0000 mg | ORAL_TABLET | Freq: Two times a day (BID) | ORAL | 0 refills | Status: DC

## 2023-01-30 MED ORDER — BENZONATATE 200 MG PO CAPS: 200.0000 mg | ORAL_CAPSULE | Freq: Four times a day (QID) | ORAL | 2 refills | Status: DC | PRN

## 2023-01-30 MED ORDER — ONDANSETRON HCL 8 MG PO TABS: 8.0000 mg | ORAL_TABLET | Freq: Four times a day (QID) | ORAL | 1 refills | Status: DC | PRN

## 2023-01-30 NOTE — Progress Notes (Signed)
   Subjective:    Patient ID: Pamela Lopez, female    DOB: 1976/02/01, 47 y.o.   MRN: [id number]  HPI Here for several issues. First she has had a dry cough off and on since early spring, and she thinks this is du to allergies. She has some PND but no fever or ST or SOB. Also 10 days ago as soon as she returned home from a cruise to [place] and the [place] she developed diarrhea and nausea. She has not vomited. No fever or abdominal pains. Using Imodium.    Review of Systems  Constitutional: Negative.   HENT:  Positive for postnasal drip. Negative for congestion, ear pain, sinus pressure and sore throat.   Eyes: Negative.   Respiratory:  Positive for cough. Negative for shortness of breath and wheezing.   Cardiovascular: Negative.   Gastrointestinal:  Positive for diarrhea and nausea. Negative for abdominal distention, abdominal pain, blood in stool, constipation and vomiting.       Objective:   Physical Exam Constitutional:      Appearance: Normal appearance. She is not ill-appearing.  HENT:     Right Ear: Tympanic membrane, ear canal and external ear normal.     Left Ear: Tympanic membrane, ear canal and external ear normal.     Nose: Nose normal.     Mouth/Throat:     Pharynx: Oropharynx is clear.  Eyes:     Conjunctiva/sclera: Conjunctivae normal.  Cardiovascular:     Rate and Rhythm: Normal rate and regular rhythm.     Pulses: Normal pulses.     Heart sounds: Normal heart sounds.  Pulmonary:     Effort: Pulmonary effort is normal.     Breath sounds: Normal breath sounds.  Abdominal:     General: Abdomen is flat. Bowel sounds are normal. There is no distension.     Palpations: Abdomen is soft. There is no mass.     Tenderness: There is no abdominal tenderness. There is no guarding or rebound.     Hernia: No hernia is present.  Lymphadenopathy:     Cervical: No cervical adenopathy.  Neurological:     Mental Status: She is alert.           Assessment & Plan:   Her cough seems to be allergic in nature. She can use Benzonatate as needed. The nausea and diarrhea are from an enteritis she picked up from the cruise. We will treat this with 7 days of Cipro and Zofran. Gershon Crane, MD

## 2023-02-14 ENCOUNTER — Other Ambulatory Visit: Payer: Self-pay | Admitting: Podiatry

## 2023-02-14 DIAGNOSIS — M79672 Pain in left foot: Secondary | ICD-10-CM

## 2023-02-14 DIAGNOSIS — M722 Plantar fascial fibromatosis: Secondary | ICD-10-CM

## 2023-02-19 ENCOUNTER — Ambulatory Visit: Payer: BC Managed Care – PPO | Admitting: Podiatry

## 2023-02-19 ENCOUNTER — Encounter: Payer: Self-pay | Admitting: Podiatry

## 2023-02-19 DIAGNOSIS — M722 Plantar fascial fibromatosis: Secondary | ICD-10-CM | POA: Diagnosis not present

## 2023-02-19 MED ORDER — TRIAMCINOLONE ACETONIDE 10 MG/ML IJ SUSP
10.0000 mg | Freq: Once | INTRAMUSCULAR | Status: AC
  Administered 2023-02-19: 10 mg

## 2023-02-19 NOTE — Progress Notes (Signed)
Subjective:   Patient ID: Pamela Lopez, female   DOB: 47 y.o.   MRN: [id number]   HPI Patient states she was doing well and has had a reoccurrence of pain in the left plantar heel with fluid buildup   ROS      Objective:  Physical Exam  Neurovascular status intact exquisite discomfort left plantar fascia insertional point tendon calcaneus     Assessment:  Acute Planter fasciitis left     Plan:  We will try this again I am hoping that we can get it under control and I discussed stretching exercises shoe gear modification.  Sterile prep injected the plantar fascia left 3 mg Kenalog 5 mg Xylocaine and applied sterile dressing

## 2023-02-27 HISTORY — PX: BICEPS TENDON REPAIR: SHX566

## 2023-04-02 ENCOUNTER — Ambulatory Visit (INDEPENDENT_AMBULATORY_CARE_PROVIDER_SITE_OTHER): Payer: BC Managed Care – PPO | Admitting: Podiatry

## 2023-04-02 ENCOUNTER — Encounter: Payer: Self-pay | Admitting: Podiatry

## 2023-04-02 DIAGNOSIS — M722 Plantar fascial fibromatosis: Secondary | ICD-10-CM | POA: Diagnosis not present

## 2023-04-02 NOTE — Progress Notes (Signed)
Subjective:   Patient ID: Pamela Lopez, female   DOB: 47 y.o.   MRN: [id number]   HPI Patient states still having pain in her left foot but it is improved some over where it was but she is still recovering from her shoulder surgery 6 weeks ago   ROS      Objective:  Physical Exam  Neurovascular status intact moderate collapse medial longitudinal arch bilateral with inflammation pain of the plantar fascia which is for the most part improved left only painful with deeper pressure     Assessment:  Long-term history of chronic fascial inflammation     Plan:  Reviewed at great length and at this point we discussed treatment options long-term.  Will get a continue conservatively with stretching ice night splint boot usage as needed but I did discuss that ultimate surgical intervention may be necessary if symptoms were to come back quickly or not improve.  Advised her on this

## 2023-05-07 ENCOUNTER — Encounter: Payer: Self-pay | Admitting: Pulmonary Disease

## 2023-05-07 ENCOUNTER — Ambulatory Visit: Payer: BC Managed Care – PPO | Admitting: Pulmonary Disease

## 2023-05-07 VITALS — BP 130/82 | HR 77 | Temp 97.4°F | Ht 66.5 in | Wt 262.0 lb

## 2023-05-07 DIAGNOSIS — G4733 Obstructive sleep apnea (adult) (pediatric): Secondary | ICD-10-CM

## 2023-05-07 MED ORDER — ESZOPICLONE 2 MG PO TABS: 2.0000 mg | ORAL_TABLET | Freq: Every evening | ORAL | 3 refills | Status: DC | PRN
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
## 2023-05-07 NOTE — Patient Instructions (Addendum)
I will see you in about 6 months  Continue weight loss efforts  Continue using your CPAP nightly  The CPAP compliance shows that it is working well  Call us with significant concerns  Prescription for Lunesta sent in-2 mg

## 2023-05-19 ENCOUNTER — Ambulatory Visit (INDEPENDENT_AMBULATORY_CARE_PROVIDER_SITE_OTHER): Payer: BC Managed Care – PPO

## 2023-05-19 DIAGNOSIS — Z23 Encounter for immunization: Secondary | ICD-10-CM | POA: Diagnosis not present

## 2023-07-01 ENCOUNTER — Encounter: Payer: Self-pay | Admitting: Family Medicine

## 2023-07-01 ENCOUNTER — Telehealth: Payer: Self-pay | Admitting: Family Medicine

## 2023-07-01 ENCOUNTER — Ambulatory Visit (INDEPENDENT_AMBULATORY_CARE_PROVIDER_SITE_OTHER): Payer: BC Managed Care – PPO | Admitting: Family Medicine

## 2023-07-01 VITALS — BP 110/78 | HR 74 | Temp 98.4°F | Ht 66.5 in | Wt 266.0 lb

## 2023-07-01 DIAGNOSIS — Z0000 Encounter for general adult medical examination without abnormal findings: Secondary | ICD-10-CM

## 2023-07-01 DIAGNOSIS — R222 Localized swelling, mass and lump, trunk: Secondary | ICD-10-CM

## 2023-07-01 LAB — BASIC METABOLIC PANEL
BUN: 16 mg/dL (ref 6–23)
CO2: 25 meq/L (ref 19–32)
Calcium: 9.1 mg/dL (ref 8.4–10.5)
Chloride: 108 meq/L (ref 96–112)
Creatinine, Ser: 0.98 mg/dL (ref 0.40–1.20)
GFR: 69.37 mL/min (ref >60.00)
Glucose, Bld: 87 mg/dL (ref 70–99)
Potassium: 4 meq/L (ref 3.5–5.1)
Sodium: 141 meq/L (ref 135–145)

## 2023-07-01 LAB — LIPID PANEL
Cholesterol: 163 mg/dL (ref 0–200)
HDL: 58.8 mg/dL (ref >39.00)
LDL Cholesterol: 82 mg/dL (ref 0–99)
NonHDL: 104.34
Total CHOL/HDL Ratio: 3
Triglycerides: 114 mg/dL (ref 0.0–149.0)
VLDL: 22.8 mg/dL (ref 0.0–40.0)

## 2023-07-01 LAB — HEPATIC FUNCTION PANEL
ALT: 9 U/L (ref 0–35)
AST: 11 U/L (ref 0–37)
Albumin: 4 g/dL (ref 3.5–5.2)
Alkaline Phosphatase: 50 U/L (ref 39–117)
Bilirubin, Direct: 0.1 mg/dL (ref 0.0–0.3)
Total Bilirubin: 0.5 mg/dL (ref 0.2–1.2)
Total Protein: 6.8 g/dL (ref 6.0–8.3)

## 2023-07-01 LAB — HEMOGLOBIN A1C: Hgb A1c MFr Bld: 5.7 % (ref 4.6–6.5)

## 2023-07-01 LAB — CBC WITH DIFFERENTIAL/PLATELET
Basophils Absolute: 0 10*3/uL (ref 0.0–0.1)
Basophils Relative: 0.5 % (ref 0.0–3.0)
Eosinophils Absolute: 0 10*3/uL (ref 0.0–0.7)
Eosinophils Relative: 0.7 % (ref 0.0–5.0)
HCT: 37.1 % (ref 36.0–46.0)
Hemoglobin: 12.3 g/dL (ref 12.0–15.0)
Lymphocytes Relative: 31.3 % (ref 12.0–46.0)
Lymphs Abs: 1.7 10*3/uL (ref 0.7–4.0)
MCHC: 33.2 g/dL (ref 30.0–36.0)
MCV: 87.2 fL (ref 78.0–100.0)
Monocytes Absolute: 0.4 10*3/uL (ref 0.1–1.0)
Monocytes Relative: 7.1 % (ref 3.0–12.0)
Neutro Abs: 3.3 10*3/uL (ref 1.4–7.7)
Neutrophils Relative %: 60.4 % (ref 43.0–77.0)
Platelets: 211 10*3/uL (ref 150.0–400.0)
RBC: 4.26 Mil/uL (ref 3.87–5.11)
RDW: 15 % (ref 11.5–15.5)
WBC: 5.4 10*3/uL (ref 4.0–10.5)

## 2023-07-01 LAB — TSH: TSH: 1.7 u[IU]/mL (ref 0.35–5.50)

## 2023-07-01 MED ORDER — PROMETHAZINE HCL 25 MG PO TABS: ORAL_TABLET | ORAL | 3 refills | Status: DC

## 2023-07-01 MED ORDER — LEVALBUTEROL TARTRATE 45 MCG/ACT IN AERO: 2.0000 | INHALATION_SPRAY | RESPIRATORY_TRACT | 11 refills | Status: DC | PRN

## 2023-07-01 NOTE — Telephone Encounter (Signed)
Requesting a referral to [facility] Dr Rayburn Ma to look at the residuals of a spot on her back from previous surgery

## 2023-07-01 NOTE — Progress Notes (Signed)
   Subjective:    Patient ID: Pamela Lopez, female    DOB: 05-04-1976, 47 y.o.   MRN: [id number]  HPI Here for a well exam. She feels well. She is still recovering from left shoulder surgery in July.    Review of Systems  Constitutional: Negative.   HENT: Negative.    Eyes: Negative.   Respiratory: Negative.    Cardiovascular: Negative.   Gastrointestinal: Negative.   Genitourinary:  Negative for decreased urine volume, difficulty urinating, dyspareunia, dysuria, enuresis, flank pain, frequency, hematuria, pelvic pain and urgency.  Musculoskeletal:  Positive for arthralgias.  Skin: Negative.   Neurological: Negative.  Negative for headaches.  Psychiatric/Behavioral: Negative.         Objective:   Physical Exam Constitutional:      General: She is not in acute distress.    Appearance: She is well-developed. She is obese.  HENT:     Head: Normocephalic and atraumatic.     Right Ear: External ear normal.     Left Ear: External ear normal.     Nose: Nose normal.     Mouth/Throat:     Pharynx: No oropharyngeal exudate.  Eyes:     General: No scleral icterus.    Conjunctiva/sclera: Conjunctivae normal.     Pupils: Pupils are equal, round, and reactive to light.  Neck:     Thyroid: No thyromegaly.     Vascular: No JVD.  Cardiovascular:     Rate and Rhythm: Normal rate and regular rhythm.     Pulses: Normal pulses.     Heart sounds: Normal heart sounds. No murmur heard.    No friction rub. No gallop.  Pulmonary:     Effort: Pulmonary effort is normal. No respiratory distress.     Breath sounds: Normal breath sounds. No wheezing or rales.  Chest:     Chest wall: No tenderness.  Abdominal:     General: Bowel sounds are normal. There is no distension.     Palpations: Abdomen is soft. There is no mass.     Tenderness: There is no abdominal tenderness. There is no guarding or rebound.  Musculoskeletal:        General: No tenderness. Normal range of motion.     Cervical  back: Normal range of motion and neck supple.  Lymphadenopathy:     Cervical: No cervical adenopathy.  Skin:    General: Skin is warm and dry.     Findings: No erythema or rash.  Neurological:     General: No focal deficit present.     Mental Status: She is alert and oriented to person, place, and time.     Cranial Nerves: No cranial nerve deficit.     Motor: No abnormal muscle tone.     Coordination: Coordination normal.     Deep Tendon Reflexes: Reflexes are normal and symmetric. Reflexes normal.  Psychiatric:        Mood and Affect: Mood normal.        Behavior: Behavior normal.        Thought Content: Thought content normal.        Judgment: Judgment normal.           Assessment & Plan:  Well exam. We discussed diet and exercise. Get fasting labs.  Gershon Crane, MD

## 2023-07-03 NOTE — Telephone Encounter (Signed)
I need more information. She did not mention this to me at her physical. What is the spot on her back and from what surgery?

## 2023-07-04 NOTE — Addendum Note (Signed)
Addended by: Gershon Crane A on: 07/04/2023 12:51 PM   Modules accepted: Orders

## 2023-07-04 NOTE — Telephone Encounter (Signed)
I understand. I have done the referral

## 2023-08-18 ENCOUNTER — Telehealth: Payer: Self-pay

## 2023-08-22 ENCOUNTER — Other Ambulatory Visit: Payer: Self-pay

## 2023-08-22 MED ORDER — AZITHROMYCIN 250 MG PO TABS: ORAL_TABLET | ORAL | 0 refills | Status: AC

## 2023-08-22 NOTE — Telephone Encounter (Signed)
Call in a Zpack  ?

## 2023-09-12 ENCOUNTER — Other Ambulatory Visit: Payer: Self-pay | Admitting: Surgery

## 2023-09-18 ENCOUNTER — Ambulatory Visit: Payer: BC Managed Care – PPO | Admitting: Family Medicine

## 2023-09-18 ENCOUNTER — Encounter: Payer: Self-pay | Admitting: Family Medicine

## 2023-09-18 VITALS — BP 110/80 | HR 74 | Temp 98.2°F | Wt 264.0 lb

## 2023-09-18 DIAGNOSIS — J019 Acute sinusitis, unspecified: Secondary | ICD-10-CM

## 2023-09-18 DIAGNOSIS — R059 Cough, unspecified: Secondary | ICD-10-CM

## 2023-09-18 LAB — POCT INFLUENZA A/B
Influenza A, POC: NEGATIVE
Influenza B, POC: NEGATIVE

## 2023-09-18 LAB — POC COVID19 BINAXNOW: SARS Coronavirus 2 Ag: NEGATIVE

## 2023-09-18 MED ORDER — FLUCONAZOLE 150 MG PO TABS: 150.0000 mg | ORAL_TABLET | Freq: Once | ORAL | 0 refills | Status: AC

## 2023-09-18 MED ORDER — CEFUROXIME AXETIL 500 MG PO TABS: 500.0000 mg | ORAL_TABLET | Freq: Two times a day (BID) | ORAL | 0 refills | Status: AC

## 2023-09-18 NOTE — Progress Notes (Signed)
   Subjective:    Patient ID: Pamela Lopez, female    DOB: [birth year], 48 y.o.   MRN: [id number]  HPI Here for 6 days of sinus pressure, PND, and coughing up green sputum. No fever or ST or SOB.    Review of Systems  Constitutional: Negative.   HENT:  Positive for congestion, postnasal drip and sinus pressure. Negative for ear pain and sore throat.   Eyes: Negative.   Respiratory:  Positive for cough. Negative for shortness of breath and wheezing.        Objective:   Physical Exam Constitutional:      Appearance: Normal appearance.  HENT:     Right Ear: Tympanic membrane, ear canal and external ear normal.     Left Ear: Tympanic membrane, ear canal and external ear normal.     Nose: Nose normal.     Mouth/Throat:     Pharynx: Oropharynx is clear.  Eyes:     Conjunctiva/sclera: Conjunctivae normal.  Pulmonary:     Effort: Pulmonary effort is normal.     Breath sounds: Normal breath sounds.  Lymphadenopathy:     Cervical: No cervical adenopathy.  Neurological:     Mental Status: She is alert.           Assessment & Plan:  Sinusitis, treat with 10 days of Cefuroxime.  Gershon Crane, MD

## 2023-09-26 ENCOUNTER — Encounter: Payer: Self-pay | Admitting: Family Medicine

## 2023-09-26 MED ORDER — FLUCONAZOLE 150 MG PO TABS: 150.0000 mg | ORAL_TABLET | Freq: Once | ORAL | 0 refills | Status: AC

## 2023-10-23 ENCOUNTER — Ambulatory Visit: Payer: BC Managed Care – PPO | Admitting: Pulmonary Disease

## 2023-10-23 ENCOUNTER — Encounter: Payer: Self-pay | Admitting: Pulmonary Disease

## 2023-10-23 VITALS — BP 112/77 | HR 79 | Ht 66.5 in | Wt 265.6 lb

## 2023-10-23 DIAGNOSIS — G4733 Obstructive sleep apnea (adult) (pediatric): Secondary | ICD-10-CM | POA: Diagnosis not present

## 2023-10-23 DIAGNOSIS — E66813 Obesity, class 3: Secondary | ICD-10-CM

## 2023-10-23 NOTE — Patient Instructions (Signed)
 DME referral  Change CPAP pressures from 10-18 to 12-16  Continue using your CPAP nightly  Call us with significant concerns  We will see you back in about 6 months
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
## 2023-11-19 ENCOUNTER — Encounter: Payer: Self-pay | Admitting: Family Medicine

## 2023-11-19 ENCOUNTER — Ambulatory Visit: Admitting: Family Medicine

## 2023-11-19 VITALS — BP 120/80 | HR 88 | Temp 98.2°F | Resp 12 | Ht 66.5 in | Wt 265.1 lb

## 2023-11-19 DIAGNOSIS — B379 Candidiasis, unspecified: Secondary | ICD-10-CM | POA: Diagnosis not present

## 2023-11-19 DIAGNOSIS — T3695XA Adverse effect of unspecified systemic antibiotic, initial encounter: Secondary | ICD-10-CM

## 2023-11-19 DIAGNOSIS — L309 Dermatitis, unspecified: Secondary | ICD-10-CM

## 2023-11-19 DIAGNOSIS — L739 Follicular disorder, unspecified: Secondary | ICD-10-CM | POA: Diagnosis not present

## 2023-11-19 MED ORDER — FLUCONAZOLE 150 MG PO TABS: 150.0000 mg | ORAL_TABLET | Freq: Once | ORAL | 0 refills | Status: AC

## 2023-11-19 MED ORDER — DOXYCYCLINE HYCLATE 100 MG PO TABS: 100.0000 mg | ORAL_TABLET | Freq: Two times a day (BID) | ORAL | 0 refills | Status: DC

## 2023-11-19 MED ORDER — NYSTATIN-TRIAMCINOLONE 100000-0.1 UNIT/GM-% EX CREA: 1.0000 | TOPICAL_CREAM | Freq: Two times a day (BID) | CUTANEOUS | 0 refills | Status: AC | PRN

## 2023-11-19 MED ORDER — NYSTATIN-TRIAMCINOLONE 100000-0.1 UNIT/GM-% EX CREA: 1.0000 | TOPICAL_CREAM | Freq: Two times a day (BID) | CUTANEOUS | 0 refills | Status: DC | PRN

## 2023-11-19 NOTE — Patient Instructions (Addendum)
 A few things to remember from today's visit:  Folliculitis of perineum - Plan: doxycycline (VIBRA-TABS) 100 MG tablet  Antibiotic-induced yeast infection - Plan: fluconazole (DIFLUCAN) 150 MG tablet  Localized dermatitis - Plan: nystatin-triamcinolone (MYCOLOG II) cream  Apply small amount of cream on affected area 2 times daily for 14 days. Monitor for new symptoms. Start antibiotic treatment. It may take a few weeks for rash to resolve.  Do not use My Chart to request refills or for acute issues that need immediate attention. If you send a my chart message, it may take a few days to be addressed, specially if I am not in the office.  Please be sure medication list is accurate. If a new problem present, please set up appointment sooner than planned today.

## 2023-11-26 ENCOUNTER — Encounter: Payer: Self-pay | Admitting: Family Medicine

## 2023-11-26 ENCOUNTER — Ambulatory Visit: Admitting: Family Medicine

## 2023-11-26 VITALS — BP 118/80 | HR 69 | Temp 98.4°F | Wt 264.0 lb

## 2023-11-26 DIAGNOSIS — J3089 Other allergic rhinitis: Secondary | ICD-10-CM

## 2023-11-26 MED ORDER — METHYLPREDNISOLONE ACETATE 80 MG/ML IJ SUSP
80.0000 mg | Freq: Once | INTRAMUSCULAR | Status: AC
  Administered 2023-11-26: 80 mg via INTRAMUSCULAR

## 2023-11-26 MED ORDER — METHYLPREDNISOLONE ACETATE 40 MG/ML IJ SUSP
40.0000 mg | Freq: Once | INTRAMUSCULAR | Status: AC
  Administered 2023-11-26: 40 mg via INTRAMUSCULAR

## 2023-11-26 NOTE — Addendum Note (Signed)
 Addended by: Carola Rhine on: 11/26/2023 10:37 AM   Modules accepted: Orders

## 2023-11-26 NOTE — Progress Notes (Signed)
   Subjective:    Patient ID: Pamela Lopez, female    DOB: [birth year], 48 y.o.   MRN: [id number]  HPI Here for seasonal allergies. She has had itchy eyes, sneezing, PND, and a dry cough. No fever. Using Allegra and Flonase. Using her inhaler several days a week.    Review of Systems  Constitutional: Negative.   HENT:  Positive for congestion, postnasal drip, rhinorrhea and sneezing. Negative for ear pain, sinus pain and sore throat.   Eyes: Negative.   Respiratory:  Positive for cough and wheezing.        Objective:   Physical Exam Constitutional:      Appearance: Normal appearance.  HENT:     Right Ear: Tympanic membrane, ear canal and external ear normal.     Left Ear: Tympanic membrane, ear canal and external ear normal.     Nose: Nose normal.     Mouth/Throat:     Pharynx: Oropharynx is clear.  Eyes:     Conjunctiva/sclera: Conjunctivae normal.  Pulmonary:     Effort: Pulmonary effort is normal.     Breath sounds: Normal breath sounds.  Lymphadenopathy:     Cervical: No cervical adenopathy.  Neurological:     Mental Status: She is alert.           Assessment & Plan:  Seasonal allergies. I suggested she switch from Allegra to Xyzal daily. We also gave her a DepoMedrol shot.  Gershon Crane, MD

## 2024-02-05 ENCOUNTER — Ambulatory Visit: Admitting: Podiatry

## 2024-02-05 DIAGNOSIS — M722 Plantar fascial fibromatosis: Secondary | ICD-10-CM | POA: Diagnosis not present

## 2024-02-05 MED ORDER — CELECOXIB 200 MG PO CAPS: 200.0000 mg | ORAL_CAPSULE | Freq: Two times a day (BID) | ORAL | 2 refills | Status: DC

## 2024-02-05 MED ORDER — TRIAMCINOLONE ACETONIDE 10 MG/ML IJ SUSP
10.0000 mg | Freq: Once | INTRAMUSCULAR | Status: AC
  Administered 2024-02-05: 10 mg via INTRA_ARTICULAR

## 2024-02-05 NOTE — Progress Notes (Signed)
 Subjective:   Patient ID: Pamela Lopez, female   DOB: 48 y.o.   MRN: [id number]   HPI Patient presents stating she has a lot of pain in her heels and its gotten worse over the last month.  States she was excellent for about 8 months and her orthotics have started to wear out after 4 years of continuous usage    ROS      Objective:  Physical Exam  Neurovascular status intact muscle strength adequate exquisite discomfort noted medial fascial band both feet at the insertion of the tendon into the calcaneus with significant flatfoot deformity noted bilateral      Assessment:  Acute plantar fasciitis bilateral with significant structural deformity bilateral     Plan:  H&P reviewed sterile prep injected the plantar fascia at insertion bilateral 3 mg Kenalog  5 mg Xylocaine  and then went ahead and casted with pedorthist for functional orthotics after she was evaluated properly and this was done by the pedorthist.

## 2024-03-05 ENCOUNTER — Other Ambulatory Visit

## 2024-03-10 ENCOUNTER — Telehealth: Payer: Self-pay | Admitting: Podiatry

## 2024-03-10 ENCOUNTER — Other Ambulatory Visit: Payer: Self-pay | Admitting: Podiatry

## 2024-03-10 ENCOUNTER — Ambulatory Visit

## 2024-03-10 MED ORDER — CELECOXIB 200 MG PO CAPS: 200.0000 mg | ORAL_CAPSULE | Freq: Two times a day (BID) | ORAL | 1 refills | Status: DC

## 2024-03-10 NOTE — Progress Notes (Signed)
 Patient presents today to pick up custom molded foot orthotics, diagnosed with Plantar Fasciitis by Dr. Charlsie Merles.   Orthotics were dispensed and fit was satisfactory. Reviewed instructions for break-in and wear. Written instructions given to patient.  Patient will follow up as needed.   Addison Bailey CPed, CFo, CFm

## 2024-03-10 NOTE — Telephone Encounter (Signed)
 Spoke to patient states it was not there can you please reorder.

## 2024-03-10 NOTE — Telephone Encounter (Signed)
 Patient came in and stated that at last appointment that an prescription for Celebrex  was supposed to be sent into the [facility] on [address] in [place] .and it was never received.

## 2024-03-10 NOTE — Telephone Encounter (Signed)
 I sent it to pharmacy on 6-29

## 2024-03-10 NOTE — Telephone Encounter (Signed)
 done

## 2024-03-15 ENCOUNTER — Encounter: Payer: Self-pay | Admitting: Family Medicine

## 2024-04-26 ENCOUNTER — Ambulatory Visit: Admitting: Pulmonary Disease

## 2024-04-26 VITALS — BP 116/79 | HR 69 | Ht 66.5 in | Wt 267.0 lb

## 2024-04-26 DIAGNOSIS — E66813 Obesity, class 3: Secondary | ICD-10-CM | POA: Diagnosis not present

## 2024-04-26 DIAGNOSIS — G4733 Obstructive sleep apnea (adult) (pediatric): Secondary | ICD-10-CM | POA: Diagnosis not present
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
## 2024-04-26 NOTE — Patient Instructions (Signed)
 Download from her machine shows that is working very well  Continue using CPAP on a nightly basis  To address increased mucus production - Gradually go up on your humidification settings as tolerated  - nasal rinses may also help before bedtime -Continue using nasal steroids  Let us  know if above changes do not help  Tentative follow-up in about 6 months  Good luck with your relocation  Your new provider just needs to request for records from our office if they are in a different system, if they also use epic 6 times they should be able to access your records

## 2024-05-12 ENCOUNTER — Ambulatory Visit: Admitting: Family Medicine

## 2024-05-12 ENCOUNTER — Encounter: Payer: Self-pay | Admitting: Family Medicine

## 2024-05-12 VITALS — BP 124/86 | HR 72 | Temp 98.0°F | Wt 271.0 lb

## 2024-05-12 DIAGNOSIS — Z23 Encounter for immunization: Secondary | ICD-10-CM | POA: Diagnosis not present

## 2024-05-12 DIAGNOSIS — I8393 Asymptomatic varicose veins of bilateral lower extremities: Secondary | ICD-10-CM | POA: Diagnosis not present

## 2024-05-12 MED ORDER — PROMETHAZINE HCL 25 MG PO TABS: ORAL_TABLET | ORAL | 3 refills | Status: AC

## 2024-05-12 MED ORDER — PHENTERMINE HCL 30 MG PO CAPS: 30.0000 mg | ORAL_CAPSULE | ORAL | 1 refills | Status: DC

## 2024-05-12 NOTE — Progress Notes (Signed)
   Subjective:    Patient ID: Pamela Lopez, female    DOB: 1976-03-09, 48 y.o.   MRN: [id number]  HPI Here for several issues. First she has gained a lot of weight recently, and she asks to try Phentermine  again. She has taken this in the past with success, but the full 37.5 mg dose made her feel jittery. Also she has a number of spider veins on both lower legs, and some of these have become somewhat painful.    Review of Systems  Constitutional: Negative.   Respiratory: Negative.    Cardiovascular: Negative.        Objective:   Physical Exam Constitutional:      Appearance: She is obese.  Cardiovascular:     Rate and Rhythm: Normal rate and regular rhythm.     Pulses: Normal pulses.     Heart sounds: Normal heart sounds.  Pulmonary:     Effort: Pulmonary effort is normal.     Breath sounds: Normal breath sounds.  Skin:    Comments: There are a number of telangectasias on both lower legs   Neurological:     Mental Status: She is alert.           Assessment & Plan:  For the obesity, she will try Phentermine  30 mg daily. For the spider veins, we will refer her to [facility]. Garnette Olmsted, MD

## 2024-05-14 ENCOUNTER — Encounter: Payer: Self-pay | Admitting: Family Medicine

## 2024-05-14 NOTE — Telephone Encounter (Signed)
 Yes the insomnia usually get better. Try the medication for 2 weeks to see if this improves

## 2024-05-20 ENCOUNTER — Ambulatory Visit: Admitting: Podiatry

## 2024-05-27 ENCOUNTER — Encounter: Payer: Self-pay | Admitting: Podiatry

## 2024-05-27 ENCOUNTER — Ambulatory Visit: Admitting: Podiatry

## 2024-05-27 DIAGNOSIS — M722 Plantar fascial fibromatosis: Secondary | ICD-10-CM | POA: Diagnosis not present

## 2024-05-27 MED ORDER — TRIAMCINOLONE ACETONIDE 10 MG/ML IJ SUSP
10.0000 mg | Freq: Once | INTRAMUSCULAR | Status: AC
  Administered 2024-05-27: 10 mg via INTRA_ARTICULAR

## 2024-05-27 NOTE — Progress Notes (Signed)
 Subjective:   Patient ID: Pamela Lopez, female   DOB: 48 y.o.   MRN: [id number]   HPI Patient states that she has had reoccurrence of pain in the plantar heel bilateral with fluid buildup and does have orthotics that she needs to have adjusted   ROS      Objective:  Physical Exam  Neuro vascular status intact with acute reoccurrence of fasciitis left over right inflammation fluid and orthotics which are somewhat intolerable for her     Assessment:  Acute plantar fasciitis bilateral     Plan:  H&P reviewed both left hurts more than right sterile prep injected the plantar fascia bilateral 3 mg Kenalog  5 mg Xylocaine  applied sterile dressing and then for the orthotics they were heated and slightly flattened reduction of the heel and enlargement on both by pedorthist

## 2024-06-03 ENCOUNTER — Encounter: Payer: Self-pay | Admitting: Pulmonary Disease

## 2024-06-07 NOTE — Telephone Encounter (Signed)
**Note De-identified  [place] Obfuscation** Please advise 

## 2024-06-24 ENCOUNTER — Ambulatory Visit: Admitting: Podiatry

## 2024-06-24 ENCOUNTER — Encounter: Payer: Self-pay | Admitting: Podiatry

## 2024-06-24 VITALS — Ht 66.5 in | Wt 271.0 lb

## 2024-06-24 DIAGNOSIS — M722 Plantar fascial fibromatosis: Secondary | ICD-10-CM

## 2024-06-24 MED ORDER — TRIAMCINOLONE ACETONIDE 10 MG/ML IJ SUSP
10.0000 mg | Freq: Once | INTRAMUSCULAR | Status: AC
  Administered 2024-06-24: 10 mg via INTRA_ARTICULAR

## 2024-06-24 MED ORDER — PREDNISONE 10 MG PO TABS: ORAL_TABLET | ORAL | 0 refills | Status: DC

## 2024-06-24 NOTE — Progress Notes (Signed)
 Subjective:   Patient ID: Pamela Lopez, female   DOB: 48 y.o.   MRN: [id number]   HPI Patient presents stating that this left heel has been awful and very hard for her to put her foot on the ground.  She does work at [employer] and this is the busiest time a year but she is having problems being able to ambulate and still concerned about her orthotics   ROS      Objective:  Physical Exam  Neurovascular status intact with continued significant severe inflammation plantar heel left with the right when doing very well     Assessment:  Due to plantar fasciitis left which has remained and has not so far settled down conservatively and has been treated for a number of years     Plan:  H&P reviewed at great length and this may end up requiring surgery but she needs to get through the next several months and at this time I did reinject the fascia 3 mg Kenalog  5 mg Xylocaine  applied sterile dressing and applied air fracture walker that I want her to wear is much as possible and put her on a 12-day steroid Dosepak.  Will see her back in the next 4 to 5 weeks make an evaluation and after the first the year may require surgical intervention

## 2024-07-01 ENCOUNTER — Encounter: Payer: Self-pay | Admitting: Family Medicine

## 2024-07-01 ENCOUNTER — Encounter: Payer: Self-pay | Admitting: Podiatry

## 2024-07-01 ENCOUNTER — Ambulatory Visit (INDEPENDENT_AMBULATORY_CARE_PROVIDER_SITE_OTHER): Admitting: Family Medicine

## 2024-07-01 VITALS — BP 118/76 | HR 68 | Temp 98.8°F | Ht 66.5 in | Wt 260.0 lb

## 2024-07-01 DIAGNOSIS — Z0000 Encounter for general adult medical examination without abnormal findings: Secondary | ICD-10-CM | POA: Diagnosis not present

## 2024-07-01 LAB — BASIC METABOLIC PANEL WITH GFR
BUN: 18 mg/dL (ref 6–23)
CO2: 26 meq/L (ref 19–32)
Calcium: 9.2 mg/dL (ref 8.4–10.5)
Chloride: 104 meq/L (ref 96–112)
Creatinine, Ser: 0.91 mg/dL (ref 0.40–1.20)
GFR: 75.29 mL/min (ref >60.00)
Glucose, Bld: 85 mg/dL (ref 70–99)
Potassium: 3.5 meq/L (ref 3.5–5.1)
Sodium: 138 meq/L (ref 135–145)

## 2024-07-01 LAB — CBC WITH DIFFERENTIAL/PLATELET
Basophils Absolute: 0 K/uL (ref 0.0–0.1)
Basophils Relative: 0.1 % (ref 0.0–3.0)
Eosinophils Absolute: 0 K/uL (ref 0.0–0.7)
Eosinophils Relative: 0.1 % (ref 0.0–5.0)
HCT: 40.9 % (ref 36.0–46.0)
Hemoglobin: 13.9 g/dL (ref 12.0–15.0)
Lymphocytes Relative: 16.2 % (ref 12.0–46.0)
Lymphs Abs: 1.5 K/uL (ref 0.7–4.0)
MCHC: 34 g/dL (ref 30.0–36.0)
MCV: 85.9 fl (ref 78.0–100.0)
Monocytes Absolute: 0.4 K/uL (ref 0.1–1.0)
Monocytes Relative: 4.8 % (ref 3.0–12.0)
Neutro Abs: 7.3 K/uL (ref 1.4–7.7)
Neutrophils Relative %: 78.8 % — ABNORMAL HIGH (ref 43.0–77.0)
Platelets: 236 K/uL (ref 150.0–400.0)
RBC: 4.76 Mil/uL (ref 3.87–5.11)
RDW: 14.2 % (ref 11.5–15.5)
WBC: 9.3 K/uL (ref 4.0–10.5)

## 2024-07-01 LAB — LIPID PANEL
Cholesterol: 166 mg/dL (ref 0–200)
HDL: 73.7 mg/dL (ref >39.00)
LDL Cholesterol: 80 mg/dL (ref 0–99)
NonHDL: 92.33
Total CHOL/HDL Ratio: 2
Triglycerides: 63 mg/dL (ref 0.0–149.0)
VLDL: 12.6 mg/dL (ref 0.0–40.0)

## 2024-07-01 LAB — URINALYSIS
Bilirubin Urine: NEGATIVE
Hgb urine dipstick: NEGATIVE
Ketones, ur: NEGATIVE
Leukocytes,Ua: NEGATIVE
Nitrite: NEGATIVE
Specific Gravity, Urine: 1.015 (ref 1.000–1.030)
Total Protein, Urine: NEGATIVE
Urine Glucose: NEGATIVE
Urobilinogen, UA: 0.2 (ref 0.0–1.0)
pH: 7.5 (ref 5.0–8.0)

## 2024-07-01 LAB — HEPATIC FUNCTION PANEL
ALT: 25 U/L (ref 0–35)
AST: 15 U/L (ref 0–37)
Albumin: 4 g/dL (ref 3.5–5.2)
Alkaline Phosphatase: 34 U/L — ABNORMAL LOW (ref 39–117)
Bilirubin, Direct: 0.1 mg/dL (ref 0.0–0.3)
Total Bilirubin: 0.5 mg/dL (ref 0.2–1.2)
Total Protein: 7.2 g/dL (ref 6.0–8.3)

## 2024-07-01 LAB — VITAMIN D 25 HYDROXY (VIT D DEFICIENCY, FRACTURES): VITD: 50.63 ng/mL (ref 30.00–100.00)

## 2024-07-01 LAB — VITAMIN B12: Vitamin B-12: 453 pg/mL (ref 211–911)

## 2024-07-01 LAB — HEMOGLOBIN A1C: Hgb A1c MFr Bld: 5.9 % (ref 4.6–6.5)

## 2024-07-01 LAB — TSH: TSH: 1.07 u[IU]/mL (ref 0.35–5.50)

## 2024-07-01 MED ORDER — LEVALBUTEROL TARTRATE 45 MCG/ACT IN AERO: 2.0000 | INHALATION_SPRAY | RESPIRATORY_TRACT | 11 refills | Status: AC | PRN

## 2024-07-01 NOTE — Progress Notes (Signed)
   Subjective:    Patient ID: Pamela Lopez, female    DOB: 1976/02/28, 48 y.o.   MRN: [id number]  HPI Here for a well exam. She feels well. She is currently taking a Prednisone  taper per Podiatry for foot pain.    Review of Systems  Constitutional: Negative.   HENT: Negative.    Eyes: Negative.   Respiratory: Negative.    Cardiovascular: Negative.   Gastrointestinal: Negative.   Genitourinary:  Negative for decreased urine volume, difficulty urinating, dyspareunia, dysuria, enuresis, flank pain, frequency, hematuria, pelvic pain and urgency.  Musculoskeletal: Negative.   Skin: Negative.   Neurological: Negative.  Negative for headaches.  Psychiatric/Behavioral: Negative.         Objective:   Physical Exam Constitutional:      General: She is not in acute distress.    Appearance: She is well-developed. She is obese.  HENT:     Head: Normocephalic and atraumatic.     Right Ear: External ear normal.     Left Ear: External ear normal.     Nose: Nose normal.     Mouth/Throat:     Pharynx: No oropharyngeal exudate.  Eyes:     General: No scleral icterus.    Conjunctiva/sclera: Conjunctivae normal.     Pupils: Pupils are equal, round, and reactive to light.  Neck:     Thyroid : No thyromegaly.     Vascular: No JVD.  Cardiovascular:     Rate and Rhythm: Normal rate and regular rhythm.     Pulses: Normal pulses.     Heart sounds: Normal heart sounds. No murmur heard.    No friction rub. No gallop.  Pulmonary:     Effort: Pulmonary effort is normal. No respiratory distress.     Breath sounds: Normal breath sounds. No wheezing or rales.  Chest:     Chest wall: No tenderness.  Abdominal:     General: Bowel sounds are normal. There is no distension.     Palpations: Abdomen is soft. There is no mass.     Tenderness: There is no abdominal tenderness. There is no guarding or rebound.  Musculoskeletal:        General: No tenderness. Normal range of motion.     Cervical  back: Normal range of motion and neck supple.  Lymphadenopathy:     Cervical: No cervical adenopathy.  Skin:    General: Skin is warm and dry.     Findings: No erythema or rash.  Neurological:     General: No focal deficit present.     Mental Status: She is alert and oriented to person, place, and time.     Cranial Nerves: No cranial nerve deficit.     Motor: No abnormal muscle tone.     Coordination: Coordination normal.     Deep Tendon Reflexes: Reflexes are normal and symmetric. Reflexes normal.  Psychiatric:        Mood and Affect: Mood normal.        Behavior: Behavior normal.        Thought Content: Thought content normal.        Judgment: Judgment normal.           Assessment & Plan:  Well exam. We discussed diet and exercise. Get fasting labs. Garnette Olmsted, MD

## 2024-07-02 ENCOUNTER — Ambulatory Visit: Payer: Self-pay | Admitting: Family Medicine

## 2024-07-02 NOTE — Telephone Encounter (Signed)
 There are NO significant abnormalities in her lab results. The neutrophil % is very slightly out of range (nothing to worry about) and the alkaline phosphatase is slightly low (we only worry if this is high, not low).

## 2024-07-07 NOTE — Telephone Encounter (Signed)
Needs handicap sticker 

## 2024-07-22 ENCOUNTER — Encounter: Payer: Self-pay | Admitting: Podiatry

## 2024-07-22 ENCOUNTER — Ambulatory Visit: Admitting: Podiatry

## 2024-07-22 DIAGNOSIS — M722 Plantar fascial fibromatosis: Secondary | ICD-10-CM | POA: Diagnosis not present

## 2024-07-26 NOTE — Progress Notes (Signed)
 Subjective:   Patient ID: Pamela Lopez, female   DOB: 48 y.o.   MRN: [id number]   HPI Patient presents stating that she is having a lot of pain in the heel despite everything we have done   ROS      Objective:  Physical Exam  Neurovascular status intact muscle strength adequate range of motion adequate with patient found to have continued severe discomfort in the left heel.  The pain is quite intense when I pressed into the fascial band at insertion calcaneus     Assessment:  Acute plantar fasciitis left with inflammation fluid of the medial band at insertion     Plan:  H&P reviewed condition and recommended correction of deformity.  Patient wants to get this done but does have a difficult schedule with her work we discussed possibly doing this the last week at of December or the first couple weeks of January.  She will go over her schedule and decide what works best and at this point I allowed her to read consent form going over the surgery complications and everything is outlined and she wants to have surgery and is scheduled for outpatient procedure signing consent form.  I explained everything is outlined and she is scheduled for outpatient surgery currently and understands total recovery.  Will take 4 to 6 months

## 2024-07-27 ENCOUNTER — Other Ambulatory Visit: Payer: Self-pay | Admitting: Orthopaedic Surgery

## 2024-07-27 DIAGNOSIS — M75102 Unspecified rotator cuff tear or rupture of left shoulder, not specified as traumatic: Secondary | ICD-10-CM

## 2024-07-27 DIAGNOSIS — M12812 Other specific arthropathies, not elsewhere classified, left shoulder: Secondary | ICD-10-CM

## 2024-08-03 ENCOUNTER — Encounter: Payer: Self-pay | Admitting: Internal Medicine

## 2024-08-03 ENCOUNTER — Ambulatory Visit: Admitting: Internal Medicine

## 2024-08-03 VITALS — BP 138/88 | HR 80 | Temp 98.4°F | Ht 66.5 in | Wt 272.4 lb

## 2024-08-03 DIAGNOSIS — R059 Cough, unspecified: Secondary | ICD-10-CM

## 2024-08-03 DIAGNOSIS — J069 Acute upper respiratory infection, unspecified: Secondary | ICD-10-CM

## 2024-08-03 LAB — POCT INFLUENZA A/B
Influenza A, POC: NEGATIVE
Influenza B, POC: NEGATIVE

## 2024-08-03 LAB — POC COVID19 BINAXNOW: SARS Coronavirus 2 Ag: NEGATIVE

## 2024-08-03 MED ORDER — BENZONATATE 100 MG PO CAPS: 100.0000 mg | ORAL_CAPSULE | Freq: Two times a day (BID) | ORAL | 0 refills | Status: AC | PRN
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
## 2024-08-05 ENCOUNTER — Encounter: Payer: Self-pay | Admitting: Family Medicine

## 2024-08-05 MED ORDER — CEFUROXIME AXETIL 500 MG PO TABS: 500.0000 mg | ORAL_TABLET | Freq: Two times a day (BID) | ORAL | 0 refills | Status: AC

## 2024-08-05 MED ORDER — FLUCONAZOLE 150 MG PO TABS: 150.0000 mg | ORAL_TABLET | Freq: Once | ORAL | 5 refills | Status: AC

## 2024-08-05 NOTE — Telephone Encounter (Signed)
 I sent in 10 days of Cefuroxime  and the yeast pill (Fluconazole )

## 2024-08-10 ENCOUNTER — Telehealth: Payer: Self-pay | Admitting: Podiatry

## 2024-08-10 NOTE — Telephone Encounter (Signed)
 Patient called and surgery is set for 08/24/2024. Patient not on blood thinners or Glp1 medications. Patient pharmacy is correct.

## 2024-08-17 ENCOUNTER — Telehealth: Payer: Self-pay | Admitting: Podiatry

## 2024-08-17 NOTE — Telephone Encounter (Signed)
 DOS- 08/24/2024  ENDOSCOPIC PLANTAR FASCIOTOMY LT- 70106  [employer] EFFECTIVE DATE- 01/24/2013  PER [employer] PORTAL, PRIOR AUTH IS NOT REQUIRED FOR CPT CODE 70106. [id number]

## 2024-08-23 ENCOUNTER — Telehealth: Payer: Self-pay | Admitting: Podiatry

## 2024-08-23 DIAGNOSIS — Z0279 Encounter for issue of other medical certificate: Secondary | ICD-10-CM

## 2024-08-23 NOTE — Telephone Encounter (Signed)
 s/w pt and DOS 08/24/24 RTW approx 11/08/24. 25.00 fee for one form-she was not told 25 for both forms. Faxing [employer] [phone] and [employer] [phone] and will email pt. She will get originals at post op 08/30/24.

## 2024-08-24 ENCOUNTER — Telehealth: Payer: Self-pay | Admitting: Podiatry

## 2024-08-24 DIAGNOSIS — M722 Plantar fascial fibromatosis: Secondary | ICD-10-CM | POA: Diagnosis not present

## 2024-08-24 NOTE — Telephone Encounter (Signed)
 See prev notes Faxed [employer] [phone] and [employer] [phone], emailed pt and left copy for pt to pick up at next visit 08/30/24 DOS 08/24/24 RTW approx 11/08/24

## 2024-08-26 ENCOUNTER — Telehealth: Payer: Self-pay

## 2024-08-26 NOTE — Telephone Encounter (Signed)
 Patient called wanting to know how long she is to keep elevating her leg and also is it normal for her to have extreme pain in the arch area. Pleas advise.

## 2024-08-27 ENCOUNTER — Encounter: Payer: Self-pay | Admitting: Podiatry

## 2024-08-30 ENCOUNTER — Ambulatory Visit: Admitting: Podiatry

## 2024-08-30 ENCOUNTER — Encounter: Payer: Self-pay | Admitting: Family Medicine

## 2024-08-30 ENCOUNTER — Encounter: Payer: Self-pay | Admitting: Podiatry

## 2024-08-30 VITALS — BP 163/98 | HR 74 | Temp 98.5°F

## 2024-08-30 DIAGNOSIS — M722 Plantar fascial fibromatosis: Secondary | ICD-10-CM

## 2024-08-30 MED ORDER — TRAMADOL HCL 50 MG PO TABS: 50.0000 mg | ORAL_TABLET | Freq: Four times a day (QID) | ORAL | 0 refills | Status: AC

## 2024-08-30 NOTE — Telephone Encounter (Signed)
 Noted. Let's just follow the BP over the next few weeks. As her pain improves, the BP should come down. If not, we can treat it. Report back in 2 weeks

## 2024-08-30 NOTE — Progress Notes (Unsigned)
 Patient presents for post-op visit today, POV #1 DOS 08/24/2024 LT EPF   I have had some concerns. There has been a lot of pain and swelling. I did take off my boot once and noticed that there was blood through the sleeve. I feel there is a lot of swelling in my foot and toes.  Patient expressed concern over not having received a call back regarding her pain level last week. .  Notes: Patient advised to follow up with PCP regarding elevated BP.   Vital Signs: Today's Vitals   08/30/24 1313 08/30/24 1315  BP: (!) 165/94 (!) 170/96  Pulse: 80 80  Temp: 98.5 F (36.9 C)   TempSrc: Oral   PainSc: 7    PainLoc: Foot       Radiographs: []  Taken [x]  Not taken  Surgical Site Assessment:  - Dressing:  [x]  Minimal dry blood, intact []  Reinforced   []  Changed     -Notes: n/a  - Incision:  [x]  CDI (clean, dry, intact)  [x]  Mild erythema  []  Drainage noted   -Notes: n/a  - Swelling:  []  None  []  Mild  [x]  Moderate   []  Significant     -Notes: n/a  - Bruising:  []  None  []  Present: medial and lateral aspect ankle   - Sutures/Staples:  []  None [x]  Intact  []  Removed Today  [x]  Plan to remove at next visit   -Cast/Splint/Pins: [x]  None []  Intact []  Removed Today []  Plan to remove at next visit []  Replaced  -Signs of infection:  [x]  None  []  Present - Describe: n/a  -DME:    []  None [x]  AFW []  Surgical shoe []  Cast  []  Splint  -Walking status:  [x]  Full WB  []  Partial WB  []  NWB  -Utilizing device:  [x]  None []  Knee Scooter []  Crutches []  Wheelchair    DVT assessment:  [x]  Denies symptoms []  Chest pain/SOB []  Pain in calf/redness/warmth   Redressed DSD and ace wrap. Educated on signs of infection, proper dressing care, pain management, and weight bearing status. Patient will contact provider with any new or worsening symptoms. The provider assessed the patient today and reviewed instructions regarding plan of care.

## 2024-08-30 NOTE — Telephone Encounter (Signed)
 Use ice in the arch and boot or splint. Should ease up over next few weeks

## 2024-08-31 NOTE — Progress Notes (Signed)
 Subjective:   Patient ID: Pamela Lopez, female   DOB: 49 y.o.   MRN: [id number]   HPI Patient seen by nurse doing well   ROS      Objective:  Physical Exam  Incision sites are healed well pain in the arch of a moderate nature     Assessment:  Normal discomfort for this.  Postop     Plan:  Did write for tramadol  to help her nurse saw patient sterile dressing reapplied continue immobilization

## 2024-09-01 ENCOUNTER — Encounter: Payer: Self-pay | Admitting: Family Medicine

## 2024-09-06 ENCOUNTER — Encounter: Payer: Self-pay | Admitting: Podiatry

## 2024-09-07 ENCOUNTER — Ambulatory Visit: Admitting: Family Medicine

## 2024-09-07 ENCOUNTER — Encounter: Payer: Self-pay | Admitting: Family Medicine

## 2024-09-07 ENCOUNTER — Ambulatory Visit: Payer: Self-pay

## 2024-09-07 VITALS — BP 144/82 | HR 95 | Temp 98.6°F | Wt 273.2 lb

## 2024-09-07 DIAGNOSIS — I10 Essential (primary) hypertension: Secondary | ICD-10-CM

## 2024-09-07 MED ORDER — AMLODIPINE BESYLATE 5 MG PO TABS: 5.0000 mg | ORAL_TABLET | Freq: Every day | ORAL | 3 refills | Status: AC

## 2024-09-07 NOTE — Patient Instructions (Signed)
 Set up follow up with Dr Johnny within 6-8 weeks to reassess blood pressure.

## 2024-09-07 NOTE — Telephone Encounter (Signed)
 FYI Only or Action Required?: FYI only for provider: appointment scheduled on 09/07/2024 at 10am with Dr Wolm Scarlet at PCP office.  Patient was last seen in primary care on 08/03/2024 by Theophilus Andrews, Tully GRADE, MD.  Called Nurse Triage reporting Hypertension.  Symptoms began after post op visit after surgery patient was aware and monitoring her blood pressure.  Interventions attempted: OTC medications: a few days ago patient took Excedrin for a headache--patient has a history of migraines and Rest, hydration, or home remedies.  Symptoms are: unchanged.  Triage Disposition: See PCP When Office is Open (Within 3 Days)  Patient/caregiver understands and will follow disposition?: Yes            Message from Harlene ORN sent at 09/07/2024  8:03 AM EST  Reason for Triage: high BP. Headaches and dizziness. Is already scheduled for 01/26, but would like to be seen sooner.   Reason for Disposition  Systolic BP >= 160 OR Diastolic >= 100  Answer Assessment - Initial Assessment Questions Patient states that she had surgery for plantar fasciitis on Jan 6th and post op on the 12th---she was found to have high blood pressure that day and was advised to contact her PCP to follow up about her blood pressure Patient then started monitoring her readings at home. Pain medication after surgery was called in on the 12th Pain has calmed down but patient states that since she started monitoring her blood pressure since the 12th she has continually had high readings. Patient states blood pressure was normal last night around 9pm.  Patient has a history of migraines---she did take some Excedrin a few days ago for a headache. Patient states the only symptom at this time is a slight headache. She states that she has occasionally had some slight lightheadedness off and on but is not having that at this time She denies any chest pain, difficulty breathing, difficulty walking, difficulty talking,  numbness/weakness, injuries, blurry vision or double vision  Patient has no diagnosed history of high blood pressure and does not take any blood pressure medications. Blood pressure during triage with this RN: 161/102 with pulse of 83 at 8:10am 148/96 with pulse of 84 at 8:15am  Patient denies any chance of pregnancy---last period was Wednesday so it is ending at this time   Patient is advised to call us  back if anything changes or with any further questions/concerns. Patient is advised that if anything worsens to go to the Emergency Room or call 911. Patient verbalized understanding.  Protocols used: Blood Pressure - High-A-AH
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
## 2024-09-07 NOTE — Telephone Encounter (Signed)
 Pt was seen this morning by Dr Micheal for this problem,pt has f/u appointment with Dr Johnny on 09/13/24

## 2024-09-13 ENCOUNTER — Encounter

## 2024-09-13 ENCOUNTER — Ambulatory Visit: Admitting: Family Medicine

## 2024-09-15 ENCOUNTER — Encounter: Payer: Self-pay | Admitting: Podiatry

## 2024-09-15 ENCOUNTER — Ambulatory Visit (INDEPENDENT_AMBULATORY_CARE_PROVIDER_SITE_OTHER): Admitting: Podiatry

## 2024-09-15 DIAGNOSIS — M722 Plantar fascial fibromatosis: Secondary | ICD-10-CM | POA: Diagnosis not present

## 2024-09-15 MED ORDER — TRIAMCINOLONE ACETONIDE 10 MG/ML IJ SUSP
10.0000 mg | Freq: Once | INTRAMUSCULAR | Status: AC
  Administered 2024-09-15: 10 mg via INTRA_ARTICULAR

## 2024-09-15 NOTE — Progress Notes (Signed)
 Subjective:   Patient ID: Pamela Lopez, female   DOB: 49 y.o.   MRN: [id number]   HPI Patient seen by nurse for left foot doing well states right heel has become sore and due for a total shoulder replacement in about 10 days   ROS      Objective:  Physical Exam  Incision healing well left stitches intact pain right plantar fascia     Assessment:  Doing well surgery left inflammatory fasciitis right     Plan:  For the right sterile prep injected the plantar fascia 3 mg Kenalog  5 mg Xylocaine  and for the left return to activity slowly and dispensed ankle compression stocking

## 2024-09-15 NOTE — Progress Notes (Signed)
 Patient presents for post-op visit today, POV #2 DOS 08/24/2024 LT EPF  Doing okay. The bottom of my foot have not had a lot of pain, the top of my foot is still irritated. Stopped the ace wrap. Some bruising and a little scab..  Notes: Has questions regarding returning to regular shoe gear. Also has questions regarding new onset of right heel pain since surgery and compensating for left foot with right.   Vital Signs: There were no vitals filed for this visit.    Radiographs: []  Taken [x]  Not taken  Surgical Site Assessment:  - Dressing:  [x]  Minimal dry blood, intact []  Reinforced   []  Changed     -Notes: n/a  - Incision:  [x]  CDI (clean, dry, intact)  []  Mild erythema  []  Drainage noted   -Notes: n/a  - Swelling:  []  None  [x]  Mild  []  Moderate   []  Significant     -Notes: n/a  - Bruising:  [x]  None  []  Present: n/a   - Sutures/Staples:  []  None [x]  Intact  [x]  Removed Today  []  Plan to remove at next visit   -Cast/Splint/Pins: [x]  None []  Intact []  Removed Today []  Plan to remove at next visit []  Replaced  -Signs of infection:  [x]  None  []  Present - Describe: n/a  -DME:    []  None [x]  AFW []  Surgical shoe []  Cast  []  Splint  -Walking status:  [x]  Full WB  []  Partial WB  []  NWB  -Utilizing device:  [x]  None []  Knee Scooter []  Crutches []  Wheelchair    DVT assessment:  [x]  Denies symptoms []  Chest pain/SOB []  Pain in calf/redness/warmth   Redressed DSD and ace wrap. Educated on signs of infection, proper dressing care, pain management, and weight bearing status. Patient will contact provider with any new or worsening symptoms. The provider assessed the patient today and reviewed instructions regarding plan of care.

## 2024-10-26 ENCOUNTER — Ambulatory Visit: Admitting: Pulmonary Disease

## 2024-10-27 ENCOUNTER — Ambulatory Visit: Admitting: Family Medicine
